# Patient Record
Sex: Male | Born: 1960 | Race: White | Hispanic: No | Marital: Single | Smoking: Current every day smoker
Health system: Southern US, Community
[De-identification: ages and names within clinical notes are randomized; demographics above are authoritative.]

## PROBLEM LIST (undated history)

## (undated) DIAGNOSIS — F32A Depression, unspecified: Secondary | ICD-10-CM

## (undated) DIAGNOSIS — B1920 Unspecified viral hepatitis C without hepatic coma: Secondary | ICD-10-CM

## (undated) DIAGNOSIS — F429 Obsessive-compulsive disorder, unspecified: Secondary | ICD-10-CM

## (undated) DIAGNOSIS — I10 Essential (primary) hypertension: Secondary | ICD-10-CM

## (undated) DIAGNOSIS — J449 Chronic obstructive pulmonary disease, unspecified: Secondary | ICD-10-CM

## (undated) DIAGNOSIS — F17200 Nicotine dependence, unspecified, uncomplicated: Secondary | ICD-10-CM

## (undated) DIAGNOSIS — F39 Unspecified mood [affective] disorder: Secondary | ICD-10-CM

## (undated) DIAGNOSIS — E669 Obesity, unspecified: Secondary | ICD-10-CM

## (undated) DIAGNOSIS — M1990 Unspecified osteoarthritis, unspecified site: Secondary | ICD-10-CM

## (undated) DIAGNOSIS — E119 Type 2 diabetes mellitus without complications: Secondary | ICD-10-CM

## (undated) DIAGNOSIS — F329 Major depressive disorder, single episode, unspecified: Secondary | ICD-10-CM

## (undated) HISTORY — DX: Essential (primary) hypertension: I10

## (undated) HISTORY — DX: Type 2 diabetes mellitus without complications: E11.9

## (undated) HISTORY — DX: Obsessive-compulsive disorder, unspecified: F42.9

## (undated) HISTORY — DX: Unspecified viral hepatitis C without hepatic coma: B19.20

## (undated) HISTORY — DX: Nicotine dependence, unspecified, uncomplicated: F17.200

## (undated) HISTORY — DX: Unspecified mood (affective) disorder: F39

## (undated) HISTORY — DX: Unspecified osteoarthritis, unspecified site: M19.90

## (undated) HISTORY — PX: COLONOSCOPY: SHX174

## (undated) HISTORY — DX: Major depressive disorder, single episode, unspecified: F32.9

## (undated) HISTORY — DX: Chronic obstructive pulmonary disease, unspecified: J44.9

## (undated) HISTORY — DX: Depression, unspecified: F32.A

## (undated) HISTORY — DX: Obesity, unspecified: E66.9

---

## 1999-10-13 ENCOUNTER — Ambulatory Visit: Admission: RE | Admit: 1999-10-13 | Discharge: 1999-10-13 | Payer: Self-pay | Admitting: Pulmonary Disease

## 1999-10-15 ENCOUNTER — Ambulatory Visit (HOSPITAL_COMMUNITY): Admission: RE | Admit: 1999-10-15 | Discharge: 1999-10-15 | Payer: Self-pay | Admitting: Pulmonary Disease

## 1999-10-15 ENCOUNTER — Encounter: Payer: Self-pay | Admitting: Pulmonary Disease

## 1999-11-16 ENCOUNTER — Emergency Department (HOSPITAL_COMMUNITY): Admission: EM | Admit: 1999-11-16 | Discharge: 1999-11-16 | Payer: Self-pay

## 1999-11-16 ENCOUNTER — Encounter: Payer: Self-pay | Admitting: Emergency Medicine

## 1999-12-01 ENCOUNTER — Ambulatory Visit (HOSPITAL_BASED_OUTPATIENT_CLINIC_OR_DEPARTMENT_OTHER): Admission: RE | Admit: 1999-12-01 | Discharge: 1999-12-01 | Payer: Self-pay | Admitting: Orthopedic Surgery

## 2002-01-26 ENCOUNTER — Emergency Department (HOSPITAL_COMMUNITY): Admission: EM | Admit: 2002-01-26 | Discharge: 2002-01-26 | Payer: Self-pay

## 2002-04-23 ENCOUNTER — Emergency Department (HOSPITAL_COMMUNITY): Admission: EM | Admit: 2002-04-23 | Discharge: 2002-04-23 | Payer: Self-pay | Admitting: Emergency Medicine

## 2008-12-15 ENCOUNTER — Ambulatory Visit: Payer: Self-pay | Admitting: Family Medicine

## 2008-12-19 ENCOUNTER — Ambulatory Visit: Payer: Self-pay | Admitting: Family Medicine

## 2009-03-19 ENCOUNTER — Observation Stay (HOSPITAL_COMMUNITY): Admission: EM | Admit: 2009-03-19 | Discharge: 2009-03-21 | Payer: Self-pay | Admitting: Emergency Medicine

## 2009-03-19 ENCOUNTER — Ambulatory Visit: Payer: Self-pay | Admitting: Gastroenterology

## 2009-04-20 ENCOUNTER — Ambulatory Visit: Payer: Self-pay | Admitting: Family Medicine

## 2009-05-14 ENCOUNTER — Ambulatory Visit: Payer: Self-pay | Admitting: Family Medicine

## 2010-01-28 ENCOUNTER — Ambulatory Visit: Payer: Self-pay | Admitting: Family Medicine

## 2010-05-05 ENCOUNTER — Ambulatory Visit: Payer: Self-pay | Admitting: Family Medicine

## 2010-05-10 ENCOUNTER — Ambulatory Visit: Payer: Self-pay | Admitting: Family Medicine

## 2010-07-06 ENCOUNTER — Ambulatory Visit: Payer: Self-pay | Admitting: Family Medicine

## 2010-11-06 LAB — CBC
Hemoglobin: 16.5 g/dL (ref 13.0–17.0)
MCHC: 34.1 g/dL (ref 30.0–36.0)
MCHC: 34.1 g/dL (ref 30.0–36.0)
MCHC: 34.2 g/dL (ref 30.0–36.0)
MCHC: 34.6 g/dL (ref 30.0–36.0)
MCV: 100.1 fL — ABNORMAL HIGH (ref 78.0–100.0)
MCV: 99.1 fL (ref 78.0–100.0)
Platelets: 183 10*3/uL (ref 150–400)
Platelets: 195 10*3/uL (ref 150–400)
RBC: 4.73 MIL/uL (ref 4.22–5.81)
RBC: 4.86 MIL/uL (ref 4.22–5.81)
RBC: 4.93 MIL/uL (ref 4.22–5.81)
RBC: 5.34 MIL/uL (ref 4.22–5.81)
RBC: 5.79 MIL/uL (ref 4.22–5.81)
RDW: 14.1 % (ref 11.5–15.5)
RDW: 14.2 % (ref 11.5–15.5)
RDW: 14.2 % (ref 11.5–15.5)
RDW: 14.3 % (ref 11.5–15.5)
RDW: 14.4 % (ref 11.5–15.5)
WBC: 10.8 10*3/uL — ABNORMAL HIGH (ref 4.0–10.5)
WBC: 10.9 10*3/uL — ABNORMAL HIGH (ref 4.0–10.5)
WBC: 8.1 10*3/uL (ref 4.0–10.5)

## 2010-11-06 LAB — BASIC METABOLIC PANEL
CO2: 26 mEq/L (ref 19–32)
Chloride: 105 mEq/L (ref 96–112)
GFR calc non Af Amer: >60 mL/min (ref >60)
Glucose, Bld: 108 mg/dL — ABNORMAL HIGH (ref 70–99)
Potassium: 4 mEq/L (ref 3.5–5.1)
Sodium: 136 mEq/L (ref 135–145)

## 2010-11-06 LAB — PROTIME-INR
INR: 1 (ref 0.00–1.49)
Prothrombin Time: 13 seconds (ref 11.6–15.2)

## 2010-11-06 LAB — POCT I-STAT, CHEM 8
Creatinine, Ser: 1 mg/dL (ref 0.4–1.5)
Glucose, Bld: 137 mg/dL — ABNORMAL HIGH (ref 70–99)
HCT: 61 % — ABNORMAL HIGH (ref 39.0–52.0)
Hemoglobin: 20.7 g/dL — ABNORMAL HIGH (ref 13.0–17.0)
Sodium: 135 mEq/L (ref 135–145)
TCO2: 20 mmol/L (ref 0–100)

## 2010-11-06 LAB — URINE CULTURE: Colony Count: 25000

## 2010-11-06 LAB — CROSSMATCH
ABO/RH(D): A NEG
Antibody Screen: NEGATIVE

## 2010-11-06 LAB — HEPATITIS PANEL, ACUTE
HCV Ab: REACTIVE — AB
Hep B C IgM: NEGATIVE
Hepatitis B Surface Ag: NEGATIVE

## 2010-11-06 LAB — GIARDIA/CRYPTOSPORIDIUM SCREEN(EIA)
Cryptosporidium Screen (EIA): NEGATIVE
Giardia Screen - EIA: NEGATIVE

## 2010-11-06 LAB — URINALYSIS, ROUTINE W REFLEX MICROSCOPIC
Glucose, UA: NEGATIVE mg/dL
Hgb urine dipstick: NEGATIVE
Protein, ur: NEGATIVE mg/dL
Specific Gravity, Urine: 1.025 (ref 1.005–1.030)
pH: 6 (ref 5.0–8.0)

## 2010-11-06 LAB — DIFFERENTIAL
Basophils Absolute: 0 10*3/uL (ref 0.0–0.1)
Basophils Relative: 0 % (ref 0–1)
Eosinophils Absolute: 0 10*3/uL (ref 0.0–0.7)
Monocytes Absolute: 1.2 10*3/uL — ABNORMAL HIGH (ref 0.1–1.0)
Monocytes Relative: 11 % (ref 3–12)
Neutro Abs: 9.6 10*3/uL — ABNORMAL HIGH (ref 1.7–7.7)
Neutrophils Relative %: 81 % — ABNORMAL HIGH (ref 43–77)

## 2010-11-06 LAB — RAPID URINE DRUG SCREEN, HOSP PERFORMED
Amphetamines: NOT DETECTED
Benzodiazepines: NOT DETECTED
Tetrahydrocannabinol: POSITIVE — AB

## 2010-11-06 LAB — HEPATIC FUNCTION PANEL
ALT: 80 U/L — ABNORMAL HIGH (ref 0–53)
AST: 49 U/L — ABNORMAL HIGH (ref 0–37)
Bilirubin, Direct: 0.1 mg/dL (ref 0.0–0.3)

## 2010-11-06 LAB — ERYTHROPOIETIN: Erythropoietin: 7.7 m[IU]/mL (ref 2.6–34.0)

## 2010-11-06 LAB — CLOSTRIDIUM DIFFICILE EIA

## 2010-11-06 LAB — TSH: TSH: 0.8 u[IU]/mL (ref 0.350–4.500)

## 2010-11-24 ENCOUNTER — Ambulatory Visit (INDEPENDENT_AMBULATORY_CARE_PROVIDER_SITE_OTHER): Payer: Self-pay | Admitting: *Deleted

## 2010-11-24 DIAGNOSIS — F39 Unspecified mood [affective] disorder: Secondary | ICD-10-CM

## 2010-11-24 DIAGNOSIS — F411 Generalized anxiety disorder: Secondary | ICD-10-CM

## 2010-11-24 DIAGNOSIS — G4730 Sleep apnea, unspecified: Secondary | ICD-10-CM

## 2010-11-24 DIAGNOSIS — F329 Major depressive disorder, single episode, unspecified: Secondary | ICD-10-CM

## 2010-12-09 ENCOUNTER — Encounter: Payer: Self-pay | Admitting: Medical

## 2010-12-14 NOTE — H&P (Signed)
[name], [name]                   ACCOUNT NO.:  [id number]   MEDICAL RECORD NO.:  [id number]          PATIENT TYPE:  EMS   LOCATION:  MAJO                         FACILITY:  [facility]   PHYSICIAN:  Eduard Clos, MDDATE OF BIRTH:  1960/08/04   DATE OF ADMISSION:  03/19/2009  DATE OF DISCHARGE:                              HISTORY & PHYSICAL   PRIMARY CARE PHYSICIAN:  Unassigned.   CHIEF COMPLAINT:  Diarrhea and bleeding per rectum.   HISTORY OF PRESENT ILLNESS:  A 50 year old male with a history of  arthritis presented to the ER because of persistent bleeding per rectum  over the last 1 day.  The patient stated symptoms started off Sunday,  that is 4 days ago, with bilateral lower quadrant pain with constant  diarrhea.  The patient had antibiotic stool months ago, amoxicillin for  some respiratory tract infection.  The diarrhea was constant and the  last 24 hours has been having bleeding per rectum.  The patient said it  is frank blood and has not had similar episode before.  The patient  denies any abdominal pain at this time.  He states that in the last 24  hours his abdominal pain has completely resolved.  He denies any nausea,  vomiting, fever, or chills.  He denies any chest pain, shortness of  breath, dizziness, loss of consciousness, focal deficits, headache.  In  the ER, the patient was found to hemoglobin around 19 which is being  confirmed again.  The patient is being admitted for further management  of his GI bleed and dehydration.   PAST MEDICAL HISTORY:  Arthritis.   PAST SURGICAL HISTORY:  Bilateral knee surgery and left rotator cuff  surgery.   MEDICATIONS ON ADMISSION:  The patient takes __________ medication for  gastritis and takes chondroitin sulfate and glucosamine for his  arthritis.   ALLERGIES:  No known drug allergies.   FAMILY HISTORY:  Noncontributory.   SOCIAL HISTORY:  The patient smokes cigarettes and drinks alcohol daily,  and also uses  cocaine.  The patient has been advised to quit all of  these habits.   REVIEW OF SYSTEMS.:  As in the history of presenting illness.  Nothing  else significant.   PHYSICAL EXAMINATION:  The patient was examined at bedside not in acute  distress.  VITAL SIGNS:  Blood pressure is 150/99, pulse 96 per minute, temperature  98.4, respirations 18, O2 sat 97%.  HEENT:  Anicteric.  No pallor.  CHEST:  Bilateral air entry present.  No rhonchi, no crepitation.  HEART:  S1 and S2 heard.  ABDOMEN:  Soft, nontender.  Bowel sounds heard.  No guarding, no  rigidity.  CNS:  Alert and oriented to time, place, and person.  Moves upper and  lower extremities 5/5.  Extremities:  Peripheral pulses felt.  No edema.   LABORATORY DATA:  CBC:  WBC on admission was 11.99, hemoglobin was 20.7,  and 61 of hematocrit which is reconfirmed as 19.7 and 57.7, MCV 100.  Basic metabolic panel:  Sodium 135, potassium 4.5, chloride 104, glucose  137,  BUN 14, creatinine 1, lipase 19.  UA is showing ketones 15,  nitrites and leukocytes are negative, glucose is negative, bilirubin  small.   ASSESSMENT:  1. Gastrointestinal bleed, possibly lower gastrointestinal.  2. Diarrhea.  3. Severe dehydration.  4. Polycythemia probably secondary to dehydration and chronic      obstructive pulmonary disease.  5. Polysubstance abuse.  6. Ongoing tobacco abuse.   PLAN:  1. We will admit the patient to Telemetry.  2. We will aggressively hydrate.  Keep the patient on strict intake      and output, on clear liquid diet.  3. CBC will be checked every 6 hours over the next 24 hours.  Type and      crossmatch, and hold, and transfuse significant loss of bladder or      if the patient becomes hemodynamically compromised.  4. GI consult has already been called by ER physician.  Dr. Juanda Chance of      [facility] has been consulted.  We will follow their      recommendation.  5. We will get a CT abdomen and pelvis to rule out colitis.   6. We will get stool studies, C dif, ova and parasites, and cultures.      Further recommendation as condition evolves.  The patient has been      strongly advised to quit his cigarette smoking, alcohol and drug      abuse.  Place the patient on Ativan p.r.n.      Eduard Clos, MD  Electronically Signed     ANK/MEDQ  D:  03/19/2009  T:  03/19/2009  Job:  [id number]

## 2010-12-14 NOTE — Discharge Summary (Signed)
NAMEDOCK, BACCAM                   ACCOUNT NO.:  [id number]   MEDICAL RECORD NO.:  [id number]          PATIENT TYPE:  OBV   LOCATION:  4710                         FACILITY:  [facility]   PHYSICIAN:  Richarda Overlie, MD       DATE OF BIRTH:  July 05, 1961   DATE OF ADMISSION:  03/19/2009  DATE OF DISCHARGE:  03/21/2009                               DISCHARGE SUMMARY   DISCHARGE DIAGNOSES:  1. Clostridium difficile colitis.  2. Dehydration.  3. Polysubstance abuse.  4. Lower gastrointestinal bleeding secondary to clostridium difficile      colitis.  5. Ongoing tobacco abuse.   </CONSULTATIOn  Dr Christella Hartigan, GI.   PROCEDURE:  CT of the abdomen and pelvis shows mural thickening  involving the colon most prominent in the rectosigmoid consistent with  pancolitis and enlargement of the left adrenal gland which cannot be  characterized at this time.  The adrenal lesion is about 13 mm.  CT scan  of the pelvis shows rectosigmoid bowel wall thickening consistent with  colitis.   SUBJECTIVE:  This is a 50 year old male who presents to the ER with a  chief complaint of diarrhea and bright red blood per rectum.  The  patient started having symptoms about 4 days prior to admission.  The  patient states that he was recently treated with amoxicillin about a  month ago for a respiratory tract infection.  The patient complained of  diffuse abdominal pain.  On initial admission, he was normotensive and  stool guaiacs were positive.  The patient was admitted for further  evaluation.   HOSPITAL COURSE:  1. Bloody diarrhea.  The patient was evaluated for bloody diarrhea by      [facility] as per the initial assessment.  Differential of his      pancolitis included ischemic secondary to cocaine use versus      infectious process.  The patient's stool studies did come back for      C. diff colitis and a 14 day course of treatment with Flagyl was      advised.  I did speak to Dr. Christella Hartigan on the day of discharge  and no      anticipated procedures are being planned at this point.  Therefore,      the patient will be discharged today.  2. Polysubstance abuse.  The patient did admit to use of alcohol and      cocaine.  The patient states that his last drink of alcohol was on      Saturday.  He did have some withdrawal symptoms on Monday and      Tuesday.  It has been almost 7 days since the patient's last drink.      I do not anticipate any withdrawal symptoms at this time.      Certainly, the patient did not demonstrate any during his stay in      the last 2 days.  The patient will be provided information for      considering outpatient rehab for polysubstance abuse prior to  discharge.  3. History of polycythemia.  The patient's initial hemoglobin at the      time of presentation was about 19.  Initial hemoglobin was 20.7.      The patient's hemoglobin did decrease to 15.8 on March 20, 2009,      but it was about 16 at the time of discharge.  A repeat CBC would      be indicated only if the patient continues to have persistent      bleeding.   DISCHARGE MEDICATIONS:  1. Flagyl 500 mg p.o. t.i.d. for 14 days.  2. Folic acid 1 mg p.o. daily.  3. Thiamine 100 mg p.o. daily.  4. Multivitamin 1 tablet p.o. daily.  5. Nicotine patch 21 mg per day x2 weeks; 14 mg per day x 6 weeks and      7 mg per day x2 weeks.  The patient is advised to return.   DISCHARGE INSTRUCTIONS:  1. The patient is advised to go on a lactose-free, mechanical soft      diet.  2. Call [facility], phone number provided, if the patient      continues to have persistent GI bleeding, nausea, vomiting and      fever.  3. Follow up with PCP in [place] in 5-7 days.      Richarda Overlie, MD  Electronically Signed     NA/MEDQ  D:  03/21/2009  T:  03/21/2009  Job:  [id number]
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
## 2010-12-21 ENCOUNTER — Ambulatory Visit (INDEPENDENT_AMBULATORY_CARE_PROVIDER_SITE_OTHER): Payer: Self-pay | Admitting: Medical

## 2010-12-21 ENCOUNTER — Encounter: Payer: Self-pay | Admitting: Medical

## 2010-12-21 VITALS — BP 130/90 | HR 60 | Ht 68.0 in | Wt 205.0 lb

## 2010-12-21 DIAGNOSIS — K219 Gastro-esophageal reflux disease without esophagitis: Secondary | ICD-10-CM

## 2010-12-21 DIAGNOSIS — F329 Major depressive disorder, single episode, unspecified: Secondary | ICD-10-CM

## 2010-12-21 DIAGNOSIS — F39 Unspecified mood [affective] disorder: Secondary | ICD-10-CM

## 2010-12-21 DIAGNOSIS — F411 Generalized anxiety disorder: Secondary | ICD-10-CM

## 2010-12-21 DIAGNOSIS — G479 Sleep disorder, unspecified: Secondary | ICD-10-CM

## 2010-12-21 DIAGNOSIS — F339 Major depressive disorder, recurrent, unspecified: Secondary | ICD-10-CM | POA: Insufficient documentation

## 2010-12-21 DIAGNOSIS — F419 Anxiety disorder, unspecified: Secondary | ICD-10-CM

## 2010-12-21 MED ORDER — CARBAMAZEPINE ER 200 MG PO CP12: 200.0000 mg | ORAL_CAPSULE | Freq: Two times a day (BID) | ORAL | Status: DC

## 2010-12-21 MED ORDER — OMEPRAZOLE 20 MG PO CPDR: 20.0000 mg | DELAYED_RELEASE_CAPSULE | Freq: Every day | ORAL | Status: DC

## 2010-12-21 NOTE — Progress Notes (Signed)
Subjective:     [name] is a 50 y.o. male who presents for follow up of depression. He saw me on 11/24/10 for depressed mood and irritability, sleep issues. At that time he was started on Depakote 500 mg twice a day and continued on Celexa 40 mg a day. Current symptoms include depressed mood, insomnia and psychomotor agitation. Symptoms have been gradually improving since that time. Patient denies difficulty concentrating, feelings of worthlessness/guilt, hopelessness and suicidal attempt. Previous treatment includes: Wellbutrin, Zoloft, Xanax, Celexa, and Depakote most recently. He complains of the following side effects from the treatment: headache.  The following portions of the patient's history were reviewed and updated as appropriate: allergies, current medications, past family history, past medical history, past social history, past surgical history and problem list.  Review of Systems A comprehensive review of systems was negative.   General: No recent weight change, no anorexia Neuro: No sleep changes Psychology: Denies suicidal ideation, hallucinations, drug use  Objective:    BP 130/90  Pulse 60  Ht 5\' 8"  (1.727 m)  Wt 205 lb (92.987 kg)  BMI 31.17 kg/m2   General appearance: alert, no distress, WD/WN, male Heart: RRR, normal S1, S2, no murmurs Lungs: CTA bilaterally, no wheezes, rhonchi, or rales Psychiatric: normal affect, behavior normal, pleasant, normal hygiene and grooming.        Assessment:   Encounter Diagnoses  Name Primary?  . GERD (gastroesophageal reflux disease) Yes  . Depression   . Anxiety   . Sleep disturbance   . Mood disorder       Plan:   GERD-improved with Prilosec over-the-counter. Prescription given today for omeprazole 20 mg.  Depression/anxiety/mood disorder-Discussed symptoms and concerns.  No real improvement with Depakote. Thus I gave him instructions to wean off of Depakote, he will continue Celexa 40 mg daily. Once he is  weaned off Depakote, he will begin carbamazepine. I spent considerable amount of time on the phone with his pharmacy researching prices and options for treatment, and carbamazepine would be the cheapest option. Gave counseling on his stressors and things that trigger his irritability. Consider counseling. He will call report in 2-3 weeks and let me know how he is doing.  Return or call sooner if worse or no improvement.  Sleep disturbance, much improved since last visit  Advised he limit his alcohol use, particularly in relation to medication interaction and overall health.  Greater than 25 minutes spent face-to-face discussing concerns, evaluation, treatment plan, and followup.

## 2010-12-21 NOTE — Patient Instructions (Signed)
Depakote 500 mg.  Take 1 tablet daily x 3 days;  Then take 1 tablet every other day for 1 week, then stop Depakote.  Celexa - continue 40mg  daily.  Carbamazepine - once you have completely weaned off Depakote,  Begin 1 tablet once daily for 3 days, then go to twice daily.

## 2010-12-28 ENCOUNTER — Telehealth: Payer: Self-pay | Admitting: Medical

## 2010-12-29 ENCOUNTER — Telehealth: Payer: Self-pay | Admitting: *Deleted

## 2010-12-29 NOTE — Telephone Encounter (Signed)
Called and notified pt of new prescription Carbamazepene 200 mg that is ready to be picked up and the cost would be  $12 a month.  Pt stated "thank you so much".  CM, LPN

## 2010-12-29 NOTE — Telephone Encounter (Signed)
I called and spoke to pharmacist [place].  We changed it to Carbamazepene 200mg  immediate release, 1 tablet 3 times daily.  This will be $12/mo.   It should be ready for him now.

## 2011-01-14 NOTE — Telephone Encounter (Signed)
DONE

## 2011-01-20 ENCOUNTER — Telehealth: Payer: Self-pay | Admitting: Medical

## 2011-01-20 NOTE — Telephone Encounter (Signed)
[name] is taking new medication, Epitol. It is working very and is helping quite a bit.

## 2011-01-21 NOTE — Telephone Encounter (Signed)
pls pull his chart

## 2011-01-24 NOTE — Telephone Encounter (Signed)
Called pt to let him know of labs that need to be done periodically because he is on Epitol.  Pt is out of town but will call to schedule a nurse visit for labs when he returns within the next month.  CM, LPN

## 2011-01-24 NOTE — Telephone Encounter (Signed)
Glad to hear its helping.  I will need to check some labs - (Carbamazipine level, ALT, CBC w/ diff) within the next month at his convenience.  This medication requires monitoring periodically.  Preferably, have him come first thing in the morning before he takes his dose for labs.

## 2011-02-08 ENCOUNTER — Encounter: Payer: Self-pay | Admitting: Medical

## 2011-02-08 ENCOUNTER — Ambulatory Visit (INDEPENDENT_AMBULATORY_CARE_PROVIDER_SITE_OTHER): Payer: Self-pay | Admitting: Medical

## 2011-02-08 VITALS — BP 116/82 | HR 80 | Temp 98.2°F | Ht 68.0 in | Wt 214.0 lb

## 2011-02-08 DIAGNOSIS — Z79899 Other long term (current) drug therapy: Secondary | ICD-10-CM

## 2011-02-08 DIAGNOSIS — R635 Abnormal weight gain: Secondary | ICD-10-CM

## 2011-02-08 DIAGNOSIS — F329 Major depressive disorder, single episode, unspecified: Secondary | ICD-10-CM

## 2011-02-08 DIAGNOSIS — F911 Conduct disorder, childhood-onset type: Secondary | ICD-10-CM

## 2011-02-08 DIAGNOSIS — R454 Irritability and anger: Secondary | ICD-10-CM

## 2011-02-08 DIAGNOSIS — F429 Obsessive-compulsive disorder, unspecified: Secondary | ICD-10-CM

## 2011-02-08 DIAGNOSIS — F3289 Other specified depressive episodes: Secondary | ICD-10-CM

## 2011-02-08 NOTE — Progress Notes (Signed)
Subjective:   HPI  [name] is a 50 y.o. male who presents for recheck on medication and chronic issues.  At his last visit on 12/21/10 we wean him off Depakote and changed him to carbamazepine twice a day. He notes for the first month this seemed to help with his mood and anger outbursts, but now it isn't helping as much.  He states he's always had a high tolerance for medication and things tend to wear off quickly.  His main symptoms continue to be anger outburst. He relates this to his alcohol use. His caretaker notes that when he drinks, he would drink usually 3 ounces of vodka, but if no one controls his access to alcohol he'll continue drink until he runs out. He notes that he usually doesn't have problems of anger if he does not drink alcohol.  Otherwise the medication is helping him sleep and helping his depression.  He is only exercising about one day per week. He has had a 14 pound weight gain since starting carbamazepine.  No other complaints today.  The following portions of the patient's history were reviewed and updated as appropriate: allergies, current medications, past family history, past medical history, past social history, past surgical history and problem list.  Past Medical History  Diagnosis Date  . COPD (chronic obstructive pulmonary disease)   . Hypertension   . Tobacco use disorder   . Depression   . Hepatitis C   . Arthritis   . Obsessive compulsive disorder     Review of Systems Constitutional: denies fever, chills, sweats, unexpected weight change, anorexia, fatigue Cardiology: denies chest pain, palpitations, edema Respiratory: denies cough, shortness of breath, wheezing Gastroenterology: denies abdominal pain, nausea, vomiting, diarrhea, constipation Ophthalmology: denies vision changes Urology: denies dysuria, difficulty urinating, hematuria, urinary frequency, urgency Neurology: no headache, weakness, tingling, numbness      Objective:   Physical Exam  General appearance: alert, no distress, WD/WN, white male, overweight Oral cavity: MMM, no lesions Neck: supple, no lymphadenopathy, no thyromegaly, no masses Heart: RRR, normal S1, S2, no murmurs Lungs: CTA bilaterally, no wheezes, rhonchi, or rales Abdomen: +bs, soft, non tender, non distended, no masses, no hepatomegaly, no splenomegaly Extremities: no edema, no cyanosis, no clubbing Pulses: 2+ symmetric, upper and lower extremities, normal cap refill Psychiatric: anxious appearing today, otherwise pleasant    Assessment :    Encounter Diagnoses  Name Primary?  . Depression Yes  . Weight gain   . Encounter for long-term (current) use of other medications   . Obsessive compulsive disorder   . Excessive anger      Plan:    Depression-improved on carbamazepine, however he is taking 3 times a day versus twice a day as instructed. I will discuss case with Dr. Susann Givens and call patient back with medication changes. He is also taking Celexa 40 mg daily. Labs today for surveillance.  Of note, his hemoglobin A1c has been borderline. We will check a Tegretol level and other labs today.  Weight gain-likely due to carbamazepine.  I will try to bring him back down to twice a day dosing.  OCD - discussed self awareness of episodes, c/t Celexa.     Anger -  spent a great deal of time talking about his anger issues. I advised he avoid alcohol together. Talk about respect for other people. Advised he use exercise, walks outside, listening to music, and having a daily routine to help deal with his issues. Advise he listen to some other  music besides heavy metal all the time.

## 2011-02-09 LAB — COMPREHENSIVE METABOLIC PANEL
Albumin: 4.5 g/dL (ref 3.5–5.2)
Alkaline Phosphatase: 58 U/L (ref 39–117)
CO2: 27 mEq/L (ref 19–32)
Chloride: 102 mEq/L (ref 96–112)
Glucose, Bld: 108 mg/dL — ABNORMAL HIGH (ref 70–99)
Potassium: 5.2 mEq/L (ref 3.5–5.3)
Sodium: 139 mEq/L (ref 135–145)
Total Protein: 7.3 g/dL (ref 6.0–8.3)

## 2011-02-09 LAB — HEMOGLOBIN A1C: Hgb A1c MFr Bld: 6 % — ABNORMAL HIGH (ref <5.7)

## 2011-02-10 ENCOUNTER — Telehealth: Payer: Self-pay | Admitting: *Deleted

## 2011-02-10 NOTE — Telephone Encounter (Addendum)
Message copied by Dorthula Perfect on Thu Feb 10, 2011  8:54 AM ------      Message from: Aleen Campi, DAVID S      Created: Thu Feb 10, 2011  8:34 AM       His glucose is still elevated and his labs show that he is at risk for diabetes but not diabetic at this time.  His liver test are elevated but not as bad as prior.  Otherwise, his kidney and lytes are ok, and the Carbamazepine level is fine.  I would like him to try the Carbamezapine just twice daily instead of 3x/day.  I want him to avoid alcohol altogether.  C/t same meds otherwise, try and walk or do some type of exercise most day of the week.  Lets see him back in 18mo, sooner prn.   Pt notified of lab results.  Pt will call back to schedule a 3 month follow up.  CM, LPN

## 2011-05-02 ENCOUNTER — Other Ambulatory Visit: Payer: Self-pay | Admitting: Family Medicine

## 2011-05-02 DIAGNOSIS — I10 Essential (primary) hypertension: Secondary | ICD-10-CM

## 2011-05-03 NOTE — Telephone Encounter (Signed)
Is this ok?

## 2011-05-09 ENCOUNTER — Other Ambulatory Visit: Payer: Self-pay | Admitting: Medical

## 2011-05-09 ENCOUNTER — Telehealth: Payer: Self-pay | Admitting: Medical

## 2011-05-09 MED ORDER — CARBAMAZEPINE ER 200 MG PO CP12: 200.0000 mg | ORAL_CAPSULE | Freq: Three times a day (TID) | ORAL | Status: DC

## 2011-05-09 NOTE — Telephone Encounter (Signed)
Call and see if he is doing ok regarding his mood on the current medications?  I sent refill requested.

## 2011-05-10 NOTE — Telephone Encounter (Signed)
Patient said that he was better with his mood changes since the medication and he said that he has not gained any weight so that's another good thing. CLS

## 2011-05-19 ENCOUNTER — Telehealth: Payer: Self-pay | Admitting: Family Medicine

## 2011-05-19 NOTE — Telephone Encounter (Signed)
[name] left message that he has been trying to get the Carbetol refilled.  It was done on 10/8.  I called [facility] [phone] and called it in again.  Left message for pt done.

## 2011-05-24 ENCOUNTER — Telehealth: Payer: Self-pay | Admitting: Family Medicine

## 2011-05-24 NOTE — Telephone Encounter (Signed)
PATIENTS RX WAS CALLED OUT TO THE [facility]. TEGRETOL 200MG  THE GENERIC BRAND. cls

## 2011-05-24 NOTE — Telephone Encounter (Signed)
Patient called about his rx Epitol. He said that carbamezapine the extended release cost over a hundred dollars and he cant afford this. Can you change it ti what it was orginally the epitol only cost him 12 dollars. CLS [facility] [phone]

## 2011-05-30 ENCOUNTER — Telehealth: Payer: Self-pay | Admitting: Medical

## 2011-05-30 NOTE — Telephone Encounter (Signed)
Rx refill for Bp medication was sent to the pharmacy on 05/30/11. CLS

## 2011-05-31 ENCOUNTER — Other Ambulatory Visit: Payer: Self-pay | Admitting: Medical

## 2011-05-31 MED ORDER — LISINOPRIL-HYDROCHLOROTHIAZIDE 20-12.5 MG PO TABS: 1.0000 | ORAL_TABLET | Freq: Every day | ORAL | Status: DC

## 2011-05-31 MED ORDER — CITALOPRAM HYDROBROMIDE 40 MG PO TABS: 40.0000 mg | ORAL_TABLET | Freq: Every day | ORAL | Status: DC

## 2011-07-27 ENCOUNTER — Telehealth: Payer: Self-pay | Admitting: Family Medicine

## 2011-07-27 MED ORDER — CARBAMAZEPINE 200 MG PO TABS: 200.0000 mg | ORAL_TABLET | Freq: Three times a day (TID) | ORAL | Status: DC

## 2011-07-27 MED ORDER — VERAPAMIL HCL 180 MG (CO) PO TB24: 180.0000 mg | ORAL_TABLET | Freq: Every day | ORAL | Status: DC

## 2011-07-27 NOTE — Telephone Encounter (Signed)
Pharmacy called and stated this was a new pt for them. Needs refill on verapamil 180  And tegretol 200 mg send to [facility] [address] [place]

## 2011-11-01 ENCOUNTER — Telehealth: Payer: Self-pay | Admitting: Internal Medicine

## 2011-11-01 NOTE — Telephone Encounter (Signed)
Would like a 90 day supply since its expensive.

## 2011-11-02 ENCOUNTER — Other Ambulatory Visit: Payer: Self-pay | Admitting: Medical

## 2011-11-02 MED ORDER — CITALOPRAM HYDROBROMIDE 40 MG PO TABS: 40.0000 mg | ORAL_TABLET | Freq: Every day | ORAL | Status: DC

## 2011-11-02 NOTE — Telephone Encounter (Signed)
Citalopram refilled.  Lets see him back soon for recheck.

## 2011-11-02 NOTE — Telephone Encounter (Signed)
Pt called back but was unable to talk to him. Did call pt back and leave a message that med was filled for him and that he needed to come in soon for a recheck that if he could call and schedule an appt that would be great.

## 2011-11-11 ENCOUNTER — Encounter: Payer: Self-pay | Admitting: Medical

## 2011-11-11 ENCOUNTER — Other Ambulatory Visit: Payer: Self-pay | Admitting: Medical

## 2011-11-11 ENCOUNTER — Ambulatory Visit (INDEPENDENT_AMBULATORY_CARE_PROVIDER_SITE_OTHER): Payer: Self-pay | Admitting: Medical

## 2011-11-11 VITALS — BP 130/90 | HR 88 | Temp 98.2°F | Resp 16 | Wt 212.0 lb

## 2011-11-11 DIAGNOSIS — I10 Essential (primary) hypertension: Secondary | ICD-10-CM

## 2011-11-11 DIAGNOSIS — Z79899 Other long term (current) drug therapy: Secondary | ICD-10-CM

## 2011-11-11 DIAGNOSIS — Z125 Encounter for screening for malignant neoplasm of prostate: Secondary | ICD-10-CM

## 2011-11-11 DIAGNOSIS — F429 Obsessive-compulsive disorder, unspecified: Secondary | ICD-10-CM

## 2011-11-11 DIAGNOSIS — F39 Unspecified mood [affective] disorder: Secondary | ICD-10-CM

## 2011-11-11 DIAGNOSIS — F17200 Nicotine dependence, unspecified, uncomplicated: Secondary | ICD-10-CM

## 2011-11-11 DIAGNOSIS — R351 Nocturia: Secondary | ICD-10-CM

## 2011-11-11 DIAGNOSIS — F329 Major depressive disorder, single episode, unspecified: Secondary | ICD-10-CM

## 2011-11-11 MED ORDER — TAMSULOSIN HCL 0.4 MG PO CAPS: 0.4000 mg | ORAL_CAPSULE | Freq: Every day | ORAL | Status: DC

## 2011-11-12 ENCOUNTER — Encounter: Payer: Self-pay | Admitting: Medical

## 2011-11-12 DIAGNOSIS — F429 Obsessive-compulsive disorder, unspecified: Secondary | ICD-10-CM | POA: Insufficient documentation

## 2011-11-12 DIAGNOSIS — I10 Essential (primary) hypertension: Secondary | ICD-10-CM | POA: Insufficient documentation

## 2011-11-12 LAB — CBC WITH DIFFERENTIAL/PLATELET
Basophils Relative: 1 % (ref 0–1)
Eosinophils Absolute: 0.2 10*3/uL (ref 0.0–0.7)
MCH: 34.2 pg — ABNORMAL HIGH (ref 26.0–34.0)
MCHC: 33.5 g/dL (ref 30.0–36.0)
Neutrophils Relative %: 62 % (ref 43–77)
Platelets: 239 10*3/uL (ref 150–400)

## 2011-11-12 LAB — COMPREHENSIVE METABOLIC PANEL
ALT: 202 U/L — ABNORMAL HIGH (ref 0–53)
AST: 138 U/L — ABNORMAL HIGH (ref 0–37)
CO2: 26 mEq/L (ref 19–32)
Creat: 0.86 mg/dL (ref 0.50–1.35)
Total Bilirubin: 0.6 mg/dL (ref 0.3–1.2)

## 2011-11-12 LAB — CARBAMAZEPINE LEVEL, TOTAL: Carbamazepine Lvl: 4.2 ug/mL (ref 4.0–12.0)

## 2011-11-12 LAB — RPR

## 2011-11-12 LAB — T4: T4, Total: 10 ug/dL (ref 5.0–12.5)

## 2011-11-12 LAB — LIPID PANEL
Cholesterol: 181 mg/dL (ref 0–200)
HDL: 41 mg/dL (ref >39)
Triglycerides: 211 mg/dL — ABNORMAL HIGH (ref <150)

## 2011-11-12 LAB — T4, FREE: Free T4: 1.26 ng/dL (ref 0.80–1.80)

## 2011-11-12 LAB — PSA: PSA: 1.14 ng/mL (ref <=4.00)

## 2011-11-12 LAB — TSH: TSH: 2.85 u[IU]/mL (ref 0.350–4.500)

## 2011-11-12 NOTE — Progress Notes (Signed)
Subjective:   HPI  [name] is a 51 y.o. male who presents for general recheck on medications.  He and his caretaker say he continues to do well on Carbamazepine and Celexa.  He does have some added stress soon as his son is getting married.  He has been overly anxious trying to pick out clothes, trying to get mentally prepared for the wedding.  He notes that his ex wife will be there as well as some other people that he doesn't normally talk to any more.  He is happy though to be a part of his son's big day.  He has a good relationship with daughter in law to be side of the family.   His caretaker notes that he takes his medication all at one time of the day, although he is suppose to be taking Carbamazepine BID.  otherwise is compliant with mediation.  He is still smoking.  No other aggravating or relieving factors.    No other c/o.  The following portions of the patient's history were reviewed and updated as appropriate: allergies, current medications, past family history, past medical history, past social history, past surgical history and problem list.  Past Medical History  Diagnosis Date  . COPD (chronic obstructive pulmonary disease)   . Hypertension   . Tobacco use disorder   . Depression   . Hepatitis C   . Arthritis   . Obsessive compulsive disorder     No Known Allergies   Review of Systems ROS reviewed and was negative other than noted in HPI or above.    Objective:   Physical Exam  General appearance: alert, WD/WN, seems anxious and somewhat irritable today HEENT: normocephalic, sclerae anicteric, TMs pearly, nares patent, no discharge or erythema, pharynx normal Oral cavity: MMM, no lesions, teeth with moderate plaque Neck: supple, no lymphadenopathy, no thyromegaly, no masses, no bruits Heart: RRR, normal S1, S2, no murmurs Lungs: CTA bilaterally, no wheezes, rhonchi, or rales Abdomen: +bs, soft, non tender, non distended, no masses, no hepatomegaly, no  splenomegaly Pulses: 2+ symmetric, upper and lower extremities, normal cap refill   Assessment and Plan :     Encounter Diagnoses  Name Primary?  . Essential hypertension, benign Yes  . Depression   . OCD (obsessive compulsive disorder)   . Mood disorder   . Encounter for long-term (current) use of other medications   . Screening PSA (prostate specific antigen)   . Nocturia   . Tobacco use disorder    HTN - c/t current medications  Depression/OCT/Mood disorder - ideally he should be seeing psychiatry but won't go due to cost and somewhat due to lack of trust with a new provider.  I question other diagnoses such as bipolar given his ease of agitation and irritability as well as mood swings.  For now he continues to do reasonably well on Carbamazepine and Celexa.  Advised he take Carbamazepine, 1 tablet BID instead of both tablets once daily.  His caretaker does a great job with him and he notes that overall he does well.  He has his anger tantrums in the mornings for a few minutes, but after that is fine most of the day.  I recommended he file for disability to get help with medical coverage as other medications would probably do better for him.  For now, we will c/t current medications as they still provide benefit for him.  General surveillance labs today.  PSA today, he declines rectal exam.  Nocturia -  likely due to BPH.  Will try Flomax for his urinary frequency.  Tobacco use - heavy tobacco user, no plans to quit.

## 2011-12-01 ENCOUNTER — Telehealth: Payer: Self-pay | Admitting: Medical

## 2011-12-01 DIAGNOSIS — I10 Essential (primary) hypertension: Secondary | ICD-10-CM

## 2011-12-01 MED ORDER — LISINOPRIL-HYDROCHLOROTHIAZIDE 20-12.5 MG PO TABS: 1.0000 | ORAL_TABLET | Freq: Every day | ORAL | Status: DC

## 2011-12-01 NOTE — Telephone Encounter (Signed)
Sent 90 day supply in

## 2011-12-01 NOTE — Telephone Encounter (Signed)
Sent 90 day supply

## 2012-02-27 ENCOUNTER — Telehealth: Payer: Self-pay | Admitting: Internal Medicine

## 2012-02-27 MED ORDER — CITALOPRAM HYDROBROMIDE 40 MG PO TABS: 40.0000 mg | ORAL_TABLET | Freq: Every day | ORAL | Status: DC

## 2012-02-27 NOTE — Telephone Encounter (Signed)
Celexa renewed 

## 2012-03-12 ENCOUNTER — Telehealth: Payer: Self-pay | Admitting: Family Medicine

## 2012-03-12 MED ORDER — VERAPAMIL HCL 180 MG (CO) PO TB24: 180.0000 mg | ORAL_TABLET | Freq: Every day | ORAL | Status: DC

## 2012-03-12 NOTE — Telephone Encounter (Signed)
Sent med in even though he is a [facility] pt

## 2012-03-27 ENCOUNTER — Telehealth: Payer: Self-pay | Admitting: Internal Medicine

## 2012-03-27 DIAGNOSIS — I10 Essential (primary) hypertension: Secondary | ICD-10-CM

## 2012-03-27 MED ORDER — LISINOPRIL-HYDROCHLOROTHIAZIDE 20-12.5 MG PO TABS: 1.0000 | ORAL_TABLET | Freq: Every day | ORAL | Status: DC

## 2012-03-27 NOTE — Telephone Encounter (Signed)
Request for refill of b/p meds sent in

## 2012-05-16 ENCOUNTER — Ambulatory Visit: Payer: Self-pay | Admitting: Medical

## 2012-07-01 DIAGNOSIS — E119 Type 2 diabetes mellitus without complications: Secondary | ICD-10-CM

## 2012-07-01 HISTORY — DX: Type 2 diabetes mellitus without complications: E11.9

## 2012-07-02 ENCOUNTER — Other Ambulatory Visit: Payer: Self-pay | Admitting: Family Medicine

## 2012-07-03 NOTE — Telephone Encounter (Signed)
Sent med in left message for pt to make apt in the next month or so

## 2012-07-03 NOTE — Telephone Encounter (Signed)
Go ahead and renew this but set him up for an appointment within the next month or so

## 2012-07-03 NOTE — Telephone Encounter (Signed)
Is this ok?

## 2012-07-10 ENCOUNTER — Encounter: Payer: Self-pay | Admitting: Medical

## 2012-07-10 ENCOUNTER — Ambulatory Visit (INDEPENDENT_AMBULATORY_CARE_PROVIDER_SITE_OTHER): Payer: Self-pay | Admitting: Medical

## 2012-07-10 VITALS — BP 118/82 | HR 82 | Temp 97.8°F | Resp 18 | Wt 214.0 lb

## 2012-07-10 DIAGNOSIS — E119 Type 2 diabetes mellitus without complications: Secondary | ICD-10-CM

## 2012-07-10 DIAGNOSIS — F411 Generalized anxiety disorder: Secondary | ICD-10-CM

## 2012-07-10 DIAGNOSIS — E669 Obesity, unspecified: Secondary | ICD-10-CM

## 2012-07-10 DIAGNOSIS — F429 Obsessive-compulsive disorder, unspecified: Secondary | ICD-10-CM

## 2012-07-10 DIAGNOSIS — B1920 Unspecified viral hepatitis C without hepatic coma: Secondary | ICD-10-CM

## 2012-07-10 DIAGNOSIS — I10 Essential (primary) hypertension: Secondary | ICD-10-CM

## 2012-07-10 DIAGNOSIS — F419 Anxiety disorder, unspecified: Secondary | ICD-10-CM

## 2012-07-10 DIAGNOSIS — F39 Unspecified mood [affective] disorder: Secondary | ICD-10-CM

## 2012-07-10 DIAGNOSIS — F329 Major depressive disorder, single episode, unspecified: Secondary | ICD-10-CM

## 2012-07-10 DIAGNOSIS — N400 Enlarged prostate without lower urinary tract symptoms: Secondary | ICD-10-CM

## 2012-07-10 DIAGNOSIS — F17200 Nicotine dependence, unspecified, uncomplicated: Secondary | ICD-10-CM

## 2012-07-10 NOTE — Progress Notes (Signed)
Subjective: Here for routine f/u.  Mr. [name] is here today accompanied by his care provider [name].    I first saw "[name]" in 10/2010, and have seen him in follow up since. He has been a longstanding patient of Dr. Jola Babinski here at our clinic since at least 2010.   He has a history of depression, anxiety, OCD, and likely mood disorder.   He has limited visits and other referrals up to this point due to costs.  He does not work.  He lives in a group home, has several roommates.  We have been working with him on his medications and mood.  He has declined psychiatry referral at least since I've known him.   I last saw him in April 2013.  At that time he was going to be attending his son's wedding, had lots of anxiety about that.   Was worried about potential arguments or conflicts since his ex-wife and her new husband were going to be there too.  He notes that the wedding actually went great, had a wonderful time, had a peaceful interaction with ex-wife, and there was no arguments or fights.  The experience went well.    His caregiver thinks his celexa needs to be increased.  He notes that he will have 2-3 months of better mood and affect, then 2-3 months of worse symptoms.  His bad times include irritability, yelling, begin agitated, quit to snap, at times worse depression, at times seems hostile, but then he can have periods of baseline anxiety without all the hostile/irritable mood.    He continues to smoke but only at 1ppd now.  He was smoking up to 3ppd in the past.  He admits to drinking 3 shots of Vodka daily.    He has hx/o hepatitis C.  In April I advised him to go ahead and get appt with hepatitis clinic at [facility].  He has still not done this.   We have postponed additional lab evaluation prior due to costs and the hopes that he would get in the hepatitis clinic and get on Medicaid or Medicare.  He last applied for Medicare 6 years ago and was denied.    He is not exercising.  Regarding  diet, he drinks water, milk and vodka.  He doesn't drink regular soda, sweet tea, or lots of sweets.  However, he does eat fruits, some vegetables, meat, and a fair amount of breads.    Here for f/u on hypertension.  Compliant with medication without complaint.  Didn't get the medication for BPH due to cost.  Past Medical History  Diagnosis Date  . COPD (chronic obstructive pulmonary disease)   . Hypertension   . Tobacco use disorder   . Depression   . Hepatitis C   . Arthritis   . Obsessive compulsive disorder   . Obesity   . Mood disorder   . Diabetes mellitus without complication 12/13   ROS as in HPI    Objective:   Physical Exam  Filed Vitals:   07/10/12 1046  BP: 118/82  Pulse: 82  Temp: 97.8 F (36.6 C)  Resp: 18    General appearance: alert, no distress, WD/WN Neck: supple, no lymphadenopathy, no thyromegaly, no masses Heart: RRR, normal S1, S2, no murmurs Lungs: CTA bilaterally, no wheezes, rhonchi, or rales Abdomen: +bs, soft, obese, RUQ tenderness, hepatomegaly, otherwise non tender, non distended, no masses, no splenomegaly Pulses: 2+ symmetric Ext: no edema  Assessment and Plan :    Encounter  Diagnoses  Name Primary?  . Type II or unspecified type diabetes mellitus without mention of complication, not stated as uncontrolled Yes  . Essential hypertension, benign   . Depression   . Mood disorder   . Anxiety   . OCD (obsessive compulsive disorder)   . Hepatitis C   . Obesity   . Tobacco use disorder   . BPH (benign prostatic hyperplasia)    DM type II - new diagnosis today.  He has had impaired glucose for some time, but HgbA1C now 6.6% today.  Begin Metformin once daily.   He is not motivated to make diet and exercise changes, but advised he work on eating healthier, exercising regularly.    HTN - controlled on current medication  Depression, mood disorder, anxiety, OCD - we have been managing his medications for now.   Ideally he would be  followed by psychiatry.   C/t celexa 40mg  daily.   I will call pharmacy about costs and options.  He is taking Carbamazepine 200mg , 2 tablets daily.   He is suppose to be using 1 tablet twice daily.  He changed on his own to once daily due to forgetting to take the medication.   Will consider XR daily vs increasing to 300mg  BID regular release.    Hep C - referral to [facility]  Tobacco use - we have discussed the dangers of tobacco prior, but he is not ready to stop and unlikely to stop  BPH - declines medication.  No worsening c/o currently.  Advised he apply for Medicaid/medicare.  He is due for other preventative care, referrals that he has declined for now.  Follow-up 36mo, pending call back

## 2012-08-01 ENCOUNTER — Other Ambulatory Visit: Payer: Self-pay | Admitting: Family Medicine

## 2012-08-02 NOTE — Telephone Encounter (Signed)
IS THIS OK 

## 2012-08-02 NOTE — Telephone Encounter (Signed)
Don't let them run out he needs an appointment

## 2012-08-02 NOTE — Telephone Encounter (Signed)
Is this ok?

## 2012-08-03 ENCOUNTER — Other Ambulatory Visit: Payer: Self-pay

## 2012-08-03 MED ORDER — CARBAMAZEPINE 200 MG PO TABS: 200.0000 mg | ORAL_TABLET | Freq: Two times a day (BID) | ORAL | Status: DC

## 2012-08-03 NOTE — Telephone Encounter (Signed)
Called med in also called pt left message that i have called in 90 days of his med but with no refill till apt

## 2012-08-04 NOTE — Telephone Encounter (Signed)
I couldn't find where we contacted him after last visit.  I noticed a refill request just came in, so call and recap what we discussed and recommendations from last visit.   Make sure that you talk to the group home representative that comes with him.  1) new diagnosis of diabetes on last visit.  We started medication Metformin to help with this sugar. Hopefully he is taking this once daily and trying to eat healthier choices.  Avoid all regular soda, sweet tea, candy, cake, pie, etc.  Needs to be very limiting on alcohol too as this raising blood sugar as well.   2) we just sent refill on Carbamazepine but changed to 200mg  three times daily.  I had advised that I would increase this but cost wise, this is still the cheapest.  So needs to change to this dosing.   He was taking 2 tablets once daily.  C/t Celexa daily.   3) I had asked for referral to hepatitis clinic.   Hopefully you have started this.  If not, REFER [facility] for [facility].  4) Advised he apply for Medicaid/medicare.   5) Dr. Susann Givens sent the refill on carbamazepine and advise recheck, so ask him to f/u with him this time in a month to recheck on mood and the new diagnosis of diabetes.

## 2012-08-06 ENCOUNTER — Other Ambulatory Visit: Payer: Self-pay | Admitting: Medical

## 2012-08-06 MED ORDER — METFORMIN HCL 500 MG PO TABS: 500.0000 mg | ORAL_TABLET | Freq: Two times a day (BID) | ORAL | Status: DC

## 2012-08-06 NOTE — Telephone Encounter (Signed)
I spoke with the group home caregiver and went over the recommendations that Kristian Covey PA-C had type in detail.CLS

## 2012-08-07 ENCOUNTER — Other Ambulatory Visit: Payer: Self-pay | Admitting: Family Medicine

## 2012-08-07 NOTE — Telephone Encounter (Signed)
Is this ok?

## 2012-09-06 ENCOUNTER — Telehealth: Payer: Self-pay | Admitting: Internal Medicine

## 2012-09-07 ENCOUNTER — Other Ambulatory Visit: Payer: Self-pay | Admitting: Family Medicine

## 2012-09-07 MED ORDER — VERAPAMIL HCL 180 MG (CO) PO TB24: 180.0000 mg | ORAL_TABLET | Freq: Every day | ORAL | Status: DC

## 2012-09-07 NOTE — Telephone Encounter (Signed)
I sent the patients refill into the pharmacy. CLS

## 2012-10-04 ENCOUNTER — Other Ambulatory Visit: Payer: Self-pay | Admitting: Family Medicine

## 2012-10-04 NOTE — Telephone Encounter (Signed)
[name] this is your pt is this ok

## 2012-10-05 NOTE — Telephone Encounter (Signed)
Resent medication Carbamazepine which he should be taking 3 times daily.  F/u in 56mo on this and new diagnosis of diabetes.

## 2012-10-08 NOTE — Telephone Encounter (Signed)
I left the patient a message about his medication and a follow up appointment. CLS

## 2012-10-29 ENCOUNTER — Telehealth: Payer: Self-pay

## 2012-10-29 ENCOUNTER — Other Ambulatory Visit: Payer: Self-pay

## 2012-10-29 MED ORDER — CARBAMAZEPINE 200 MG PO TABS: 200.0000 mg | ORAL_TABLET | Freq: Two times a day (BID) | ORAL | Status: DC

## 2012-10-29 NOTE — Telephone Encounter (Signed)
Cover him until he comes in for his next exam which should be April

## 2012-10-29 NOTE — Telephone Encounter (Signed)
CALLED PT LEFT MESSAGE THAT HE NEEDS TO MAKE A DIABETIC FOLLOW UP AND THAT I COULD REFILL MED 1 MTH BUT HE NEEDS APT

## 2012-10-29 NOTE — Telephone Encounter (Signed)
FAX CAME IN FOR CARBAMAZEPINE 200 MG # 270 TAKE 1 TAB TID PLEASE ADVISE

## 2012-11-02 ENCOUNTER — Telehealth: Payer: Self-pay | Admitting: Internal Medicine

## 2012-11-05 ENCOUNTER — Other Ambulatory Visit: Payer: Self-pay | Admitting: Medical

## 2012-11-05 MED ORDER — CITALOPRAM HYDROBROMIDE 40 MG PO TABS: 40.0000 mg | ORAL_TABLET | Freq: Every day | ORAL | Status: DC

## 2012-11-06 NOTE — Telephone Encounter (Signed)
done

## 2012-11-20 ENCOUNTER — Other Ambulatory Visit: Payer: Self-pay | Admitting: Family Medicine

## 2012-12-12 ENCOUNTER — Ambulatory Visit (INDEPENDENT_AMBULATORY_CARE_PROVIDER_SITE_OTHER): Payer: Self-pay | Admitting: Medical

## 2012-12-12 ENCOUNTER — Encounter: Payer: Self-pay | Admitting: Medical

## 2012-12-12 VITALS — BP 150/82 | HR 100 | Temp 98.0°F | Resp 18 | Wt 207.0 lb

## 2012-12-12 DIAGNOSIS — F17200 Nicotine dependence, unspecified, uncomplicated: Secondary | ICD-10-CM

## 2012-12-12 DIAGNOSIS — I10 Essential (primary) hypertension: Secondary | ICD-10-CM

## 2012-12-12 DIAGNOSIS — B1920 Unspecified viral hepatitis C without hepatic coma: Secondary | ICD-10-CM

## 2012-12-12 DIAGNOSIS — F39 Unspecified mood [affective] disorder: Secondary | ICD-10-CM

## 2012-12-12 DIAGNOSIS — E119 Type 2 diabetes mellitus without complications: Secondary | ICD-10-CM

## 2012-12-12 DIAGNOSIS — S20219A Contusion of unspecified front wall of thorax, initial encounter: Secondary | ICD-10-CM

## 2012-12-12 DIAGNOSIS — F329 Major depressive disorder, single episode, unspecified: Secondary | ICD-10-CM

## 2012-12-12 DIAGNOSIS — F341 Dysthymic disorder: Secondary | ICD-10-CM

## 2012-12-12 LAB — CBC WITH DIFFERENTIAL/PLATELET
Eosinophils Absolute: 0.1 10*3/uL (ref 0.0–0.7)
Hemoglobin: 16.1 g/dL (ref 13.0–17.0)
Lymphocytes Relative: 21 % (ref 12–46)
Lymphs Abs: 1.6 10*3/uL (ref 0.7–4.0)
MCH: 32.9 pg (ref 26.0–34.0)
Monocytes Relative: 9 % (ref 3–12)
Neutro Abs: 5.3 10*3/uL (ref 1.7–7.7)
Neutrophils Relative %: 68 % (ref 43–77)
Platelets: 301 10*3/uL (ref 150–400)
RBC: 4.89 MIL/uL (ref 4.22–5.81)
WBC: 7.8 10*3/uL (ref 4.0–10.5)

## 2012-12-12 LAB — COMPREHENSIVE METABOLIC PANEL
ALT: 97 U/L — ABNORMAL HIGH (ref 0–53)
AST: 56 U/L — ABNORMAL HIGH (ref 0–37)
Albumin: 4 g/dL (ref 3.5–5.2)
CO2: 29 mEq/L (ref 19–32)
Calcium: 9.7 mg/dL (ref 8.4–10.5)
Chloride: 104 mEq/L (ref 96–112)
Creat: 0.82 mg/dL (ref 0.50–1.35)
Potassium: 5.1 mEq/L (ref 3.5–5.3)
Sodium: 140 mEq/L (ref 135–145)
Total Protein: 7.3 g/dL (ref 6.0–8.3)

## 2012-12-12 LAB — LIPID PANEL
HDL: 38 mg/dL — ABNORMAL LOW (ref >39)
Total CHOL/HDL Ratio: 4.9 Ratio
VLDL: 27 mg/dL (ref 0–40)

## 2012-12-12 MED ORDER — METFORMIN HCL 500 MG PO TABS: 500.0000 mg | ORAL_TABLET | Freq: Two times a day (BID) | ORAL | Status: DC

## 2012-12-12 MED ORDER — TRAMADOL HCL 50 MG PO TABS: 50.0000 mg | ORAL_TABLET | Freq: Three times a day (TID) | ORAL | Status: DC | PRN
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
## 2012-12-13 LAB — HEMOGLOBIN A1C
Hgb A1c MFr Bld: 5.8 % — ABNORMAL HIGH (ref <5.7)
Mean Plasma Glucose: 120 mg/dL — ABNORMAL HIGH (ref <117)

## 2012-12-13 LAB — TSH: TSH: 1.528 u[IU]/mL (ref 0.350–4.500)

## 2012-12-13 LAB — RPR

## 2012-12-13 LAB — T4: T4, Total: 9.5 ug/dL (ref 5.0–12.5)

## 2012-12-14 ENCOUNTER — Other Ambulatory Visit: Payer: Self-pay | Admitting: Medical

## 2012-12-18 ENCOUNTER — Telehealth: Payer: Self-pay | Admitting: Family Medicine

## 2012-12-18 NOTE — Telephone Encounter (Signed)
Message copied by Janeice Robinson on Tue Dec 18, 2012  3:25 PM ------      Message from: Jac Canavan      Created: Tue Dec 18, 2012  1:20 PM       After speaking with Dr. Susann Givens, we are not sure what other medications to pursue instead of Tegretol.   At this point, I would recommend a visit with psychiatry to help with medication regimen.               pls either refer or have them set up appt for psychiatry - such as Dr. Ann Maki [facility]'s office, [facility], [place], etc. ------

## 2012-12-18 NOTE — Telephone Encounter (Signed)
LMOM TO CB. X 2 CLS 

## 2012-12-26 NOTE — Telephone Encounter (Signed)
lmom to cb. cls 

## 2012-12-26 NOTE — Telephone Encounter (Signed)
I spoke with the patient and I spoke with the care giver and the caregiver said they can't afford to go see a psychiatry. They want to know can you up the medications that he is on because he needs something to adjust his mood or he will have to kick him out. CLS

## 2012-12-28 ENCOUNTER — Other Ambulatory Visit: Payer: Self-pay | Admitting: Medical

## 2012-12-28 MED ORDER — BUPROPION HCL 100 MG PO TABS: 100.0000 mg | ORAL_TABLET | Freq: Two times a day (BID) | ORAL | Status: DC

## 2012-12-28 NOTE — Telephone Encounter (Signed)
although I will work with him, he is going to have to go to social services to get on medicare, and he will need to get appt with psychiatry.     For now, I am adding on Wellbutrin 100mg  BID to his regimen.  C/t Carbamezapine, c/t Celexa as well.

## 2012-12-28 NOTE — Telephone Encounter (Signed)
Patient's caregiver is aware of the change in the medication. CLS The caregiver said thank you Kristian Covey PA-C. CLS

## 2012-12-28 NOTE — Telephone Encounter (Signed)
LMOM TO CB. CLS 

## 2013-02-04 ENCOUNTER — Other Ambulatory Visit: Payer: Self-pay | Admitting: Medical

## 2013-02-04 NOTE — Telephone Encounter (Signed)
Is it okay to refill? CLS

## 2013-02-05 NOTE — Telephone Encounter (Signed)
Refilled medication, but have him return regarding the Wellbutrin we added last visit

## 2013-02-05 NOTE — Telephone Encounter (Signed)
I left a message on the voicemail for him to call and schedule that follow up appointment. CLS

## 2013-03-09 ENCOUNTER — Other Ambulatory Visit: Payer: Self-pay | Admitting: Medical

## 2013-03-17 ENCOUNTER — Other Ambulatory Visit: Payer: Self-pay | Admitting: Medical

## 2013-03-31 ENCOUNTER — Other Ambulatory Visit: Payer: Self-pay | Admitting: Family Medicine

## 2013-04-02 NOTE — Telephone Encounter (Signed)
I think this is your pt

## 2013-04-03 NOTE — Telephone Encounter (Signed)
I HAVE CALLED PT WITH NO ANSWER

## 2013-04-03 NOTE — Telephone Encounter (Signed)
Pls call patient/caregiver.  I received refill request on medication for mood.  He is currently on 3 medications and at last visit wasn't doing so well with mood.    Per last correspondance, has he went to social services to apply for medicare?  If not, why not?   Has he made an appt with psychiatry?

## 2013-04-03 NOTE — Telephone Encounter (Signed)
I spoke with the caregiver and he said that [name] was doing fine. His mood is fine. He is doing fine with the medications. He did go to social services and they told him he was not at the right place and they sent him somewhere but nothing has been taken care of yet. No, he has not seen the psychiatry because they can;'t afford it. CLS

## 2013-04-04 NOTE — Telephone Encounter (Signed)
refilll

## 2013-04-10 ENCOUNTER — Telehealth: Payer: Self-pay | Admitting: Internal Medicine

## 2013-04-10 NOTE — Telephone Encounter (Signed)
Medication sent 03/13/13.  pls verify that it went to pharmacy.

## 2013-04-10 NOTE — Telephone Encounter (Signed)
Pt needs a refill on carbamazepine 200mg  #270 for a 90 day supply to [address].

## 2013-04-11 MED ORDER — CARBAMAZEPINE 200 MG PO TABS: ORAL_TABLET | ORAL | Status: DC

## 2013-04-11 NOTE — Telephone Encounter (Signed)
It was sent but pt cancelled it cause it was expensive and they wanted it sent to a new pharmacy. i have sent to new pharmacy

## 2013-04-15 ENCOUNTER — Other Ambulatory Visit: Payer: Self-pay | Admitting: Medical

## 2013-05-05 ENCOUNTER — Other Ambulatory Visit: Payer: Self-pay | Admitting: Medical

## 2013-05-06 NOTE — Telephone Encounter (Signed)
Is this okay to refill? 

## 2013-05-24 ENCOUNTER — Other Ambulatory Visit: Payer: Self-pay | Admitting: Medical

## 2013-05-24 NOTE — Telephone Encounter (Signed)
Is this okay to refill? 

## 2013-05-25 NOTE — Telephone Encounter (Signed)
Time for recheck/OV.  Bring all pill bottles.

## 2013-05-27 NOTE — Telephone Encounter (Signed)
Left message on voicemail to check and appt

## 2013-05-28 ENCOUNTER — Encounter: Payer: Self-pay | Admitting: Medical

## 2013-05-28 ENCOUNTER — Ambulatory Visit (INDEPENDENT_AMBULATORY_CARE_PROVIDER_SITE_OTHER): Payer: Self-pay | Admitting: Medical

## 2013-05-28 VITALS — BP 128/78 | HR 92 | Temp 97.7°F | Resp 18 | Wt 205.0 lb

## 2013-05-28 DIAGNOSIS — I10 Essential (primary) hypertension: Secondary | ICD-10-CM

## 2013-05-28 DIAGNOSIS — F39 Unspecified mood [affective] disorder: Secondary | ICD-10-CM

## 2013-05-28 DIAGNOSIS — E669 Obesity, unspecified: Secondary | ICD-10-CM

## 2013-05-28 DIAGNOSIS — F329 Major depressive disorder, single episode, unspecified: Secondary | ICD-10-CM

## 2013-05-28 DIAGNOSIS — E119 Type 2 diabetes mellitus without complications: Secondary | ICD-10-CM

## 2013-05-28 DIAGNOSIS — B1920 Unspecified viral hepatitis C without hepatic coma: Secondary | ICD-10-CM

## 2013-05-28 DIAGNOSIS — Z9119 Patient's noncompliance with other medical treatment and regimen: Secondary | ICD-10-CM

## 2013-05-28 DIAGNOSIS — E785 Hyperlipidemia, unspecified: Secondary | ICD-10-CM

## 2013-05-28 LAB — POCT GLYCOSYLATED HEMOGLOBIN (HGB A1C): Hemoglobin A1C: 5.8

## 2013-05-28 LAB — POCT UA - MICROALBUMIN: Albumin/Creatinine Ratio, Urine, POC: 12.3

## 2013-05-28 MED ORDER — CITALOPRAM HYDROBROMIDE 40 MG PO TABS: 40.0000 mg | ORAL_TABLET | Freq: Every day | ORAL | Status: DC

## 2013-05-28 MED ORDER — BUPROPION HCL 100 MG PO TABS: 100.0000 mg | ORAL_TABLET | Freq: Two times a day (BID) | ORAL | Status: DC

## 2013-05-28 MED ORDER — VERAPAMIL HCL ER 180 MG PO TBCR: 180.0000 mg | EXTENDED_RELEASE_TABLET | Freq: Every day | ORAL | Status: DC

## 2013-05-28 MED ORDER — ATORVASTATIN CALCIUM 20 MG PO TABS: 20.0000 mg | ORAL_TABLET | Freq: Every day | ORAL | Status: DC

## 2013-05-28 NOTE — Progress Notes (Signed)
Subjective: Here for general f/u.  Accompanied by his caretaker.  Been doing well in general.  He notes that he recently went on a 10 day vacation with his caretaker to [place], [place] and [place]. [place].  He had a great time at [employer].   He is going with his son to 2 concerts upcoming.  He plans to see [employer], and 2 other metal bands in October and November.    He and his caretaker said his mood has been pretty good in the last several months. He thinks things are better after starting the Wellbutrin. He continues to take carbamazepine 3 times a day, Celexa as well. He has cut out alcohol altogether. His caretaker bought a safe, and all alcohol products including cooking alcohol is locked up. He is compliant with his metformin twice a day. He is not checking his sugars he is trying use some discretion or portion sizes. However he notes that he does eat a lot of cheese, he can't control himself with hotdog and cheeseburgers and will just keep eating. He does still smoke, has no plans to quit smoking.  He is compliant with all of his other medications without complaint.  He has still not went to see hepatitis clinic.  He has still not went to SSI/DSS to pursue medicaid/medicare insurance.  No good reason that he hasn't pursued it.   He feels like the wax in his ear is impacted, wants to wait for next time to have this flushed out.  However he has a new skin lesion beside his left scrotum that he wants looked at. First noticed this probably year ago, but it has gotten a little bigger.  Past Medical History  Diagnosis Date  . COPD (chronic obstructive pulmonary disease)   . Hypertension   . Tobacco use disorder   . Depression   . Hepatitis C   . Arthritis   . Obsessive compulsive disorder   . Obesity   . Mood disorder   . Diabetes mellitus without complication 12/13    Objective: BP 128/78  Pulse 92  Temp(Src) 97.7 F (36.5 C) (Oral)  Resp 18  Wt 205 lb  (92.987 kg)  BMI 31.18 kg/m2  General appearance: alert, no distress, WD/WN Skin: right inguinal region with brownish/purple 2cm x 1.5cm raised somewhat fleshy/warty lesion Oral cavity: MMM, no lesions Neck: supple, no lymphadenopathy, no thyromegaly, no masses Heart: RRR, normal S1, S2, no murmurs Lungs: CTA bilaterally, no wheezes, rhonchi, or rales Abdomen: +bs, soft, non tender, non distended, no masses, no hepatomegaly, no splenomegaly Pulses: 2+ symmetric, upper and lower extremities, normal cap refill Ext: no edema   Assessment: Encounter Diagnoses  Name Primary?  . Type II or unspecified type diabetes mellitus without mention of complication, not stated as uncontrolled Yes  . Dyslipidemia (high LDL; low HDL)   . Essential hypertension, benign   . Depression   . Mood disorder   . Obesity, unspecified   . Hepatitis C   . Noncompliance    Plan: DM type II - advised he do better with his diet, have a little more control, but continue same medication. Hemoglobin A1c was 5.8% today. Dyslipidemia-reviewing his prior labs, I decided to start him on Lipitor today in light of his diabetes diagnosis HTN - controlled on current medication Depression, mood disorder-seems to be doing well at this time on current regimen, he is not drinking alcohol at the moment Obesity-advise he continue work on weight loss measures Hepatitis C,  noncompliance-he still has not went to hepatitis clinic, and I can't seem to get him to do this.  We have suggested numerous times for him to go, to also look into Medicare/other insurance options  He declines flu shot today. I had supervising physician Dr. Susann Givens look at his skin lesion, appears to be seborrheic keratosis, and the decision was made to leave this alone F/u 80mo

## 2013-06-25 ENCOUNTER — Other Ambulatory Visit: Payer: Self-pay | Admitting: Medical

## 2013-06-26 NOTE — Telephone Encounter (Signed)
Is this okay to refill? 

## 2013-07-16 ENCOUNTER — Other Ambulatory Visit: Payer: Self-pay | Admitting: Medical

## 2013-07-17 ENCOUNTER — Other Ambulatory Visit: Payer: Self-pay | Admitting: Medical

## 2013-07-22 ENCOUNTER — Other Ambulatory Visit: Payer: Self-pay | Admitting: Family Medicine

## 2013-07-22 MED ORDER — CARBAMAZEPINE 200 MG PO TABS: 200.0000 mg | ORAL_TABLET | Freq: Three times a day (TID) | ORAL | Status: DC

## 2013-09-04 ENCOUNTER — Other Ambulatory Visit: Payer: Self-pay | Admitting: Medical

## 2013-09-05 ENCOUNTER — Other Ambulatory Visit: Payer: Self-pay | Admitting: Medical

## 2013-09-21 ENCOUNTER — Other Ambulatory Visit: Payer: Self-pay | Admitting: Medical

## 2013-10-20 ENCOUNTER — Other Ambulatory Visit: Payer: Self-pay | Admitting: Medical

## 2013-10-21 ENCOUNTER — Other Ambulatory Visit: Payer: Self-pay | Admitting: Medical

## 2013-11-15 ENCOUNTER — Other Ambulatory Visit: Payer: Self-pay | Admitting: Medical

## 2013-11-18 ENCOUNTER — Other Ambulatory Visit: Payer: Self-pay | Admitting: Medical

## 2013-11-18 ENCOUNTER — Telehealth: Payer: Self-pay | Admitting: Medical

## 2013-11-18 NOTE — Telephone Encounter (Signed)
pls handle the refill.  It came directly from the pharmacy but had error in the link

## 2014-03-03 ENCOUNTER — Other Ambulatory Visit: Payer: Self-pay | Admitting: Medical

## 2014-03-04 NOTE — Telephone Encounter (Signed)
Is this okay to refill? 

## 2014-03-06 ENCOUNTER — Other Ambulatory Visit: Payer: Self-pay | Admitting: Medical

## 2014-03-06 ENCOUNTER — Telehealth: Payer: Self-pay | Admitting: Internal Medicine

## 2014-03-06 MED ORDER — BUPROPION HCL 100 MG PO TABS: 100.0000 mg | ORAL_TABLET | Freq: Two times a day (BID) | ORAL | Status: DC

## 2014-03-06 NOTE — Telephone Encounter (Signed)
Pt has an appt at the end of this month and he needs a refill on bupropion 100mg  to [facility] [address] to get to his appt

## 2014-03-06 NOTE — Telephone Encounter (Signed)
[name], ok to refill?

## 2014-03-27 ENCOUNTER — Encounter: Payer: Self-pay | Admitting: Medical

## 2014-03-27 ENCOUNTER — Telehealth: Payer: Self-pay | Admitting: Medical

## 2014-03-27 ENCOUNTER — Ambulatory Visit (INDEPENDENT_AMBULATORY_CARE_PROVIDER_SITE_OTHER): Payer: Self-pay | Admitting: Medical

## 2014-03-27 VITALS — BP 140/90 | HR 100 | Temp 97.6°F | Resp 16 | Wt 200.0 lb

## 2014-03-27 DIAGNOSIS — B351 Tinea unguium: Secondary | ICD-10-CM

## 2014-03-27 DIAGNOSIS — B182 Chronic viral hepatitis C: Secondary | ICD-10-CM

## 2014-03-27 DIAGNOSIS — J449 Chronic obstructive pulmonary disease, unspecified: Secondary | ICD-10-CM

## 2014-03-27 DIAGNOSIS — H6120 Impacted cerumen, unspecified ear: Secondary | ICD-10-CM

## 2014-03-27 DIAGNOSIS — F0630 Mood disorder due to known physiological condition, unspecified: Secondary | ICD-10-CM

## 2014-03-27 DIAGNOSIS — F329 Major depressive disorder, single episode, unspecified: Secondary | ICD-10-CM

## 2014-03-27 DIAGNOSIS — F17200 Nicotine dependence, unspecified, uncomplicated: Secondary | ICD-10-CM

## 2014-03-27 DIAGNOSIS — F3289 Other specified depressive episodes: Secondary | ICD-10-CM

## 2014-03-27 DIAGNOSIS — H6123 Impacted cerumen, bilateral: Secondary | ICD-10-CM

## 2014-03-27 DIAGNOSIS — F32A Depression, unspecified: Secondary | ICD-10-CM

## 2014-03-27 DIAGNOSIS — I10 Essential (primary) hypertension: Secondary | ICD-10-CM

## 2014-03-27 DIAGNOSIS — E785 Hyperlipidemia, unspecified: Secondary | ICD-10-CM | POA: Insufficient documentation

## 2014-03-27 DIAGNOSIS — E119 Type 2 diabetes mellitus without complications: Secondary | ICD-10-CM | POA: Insufficient documentation

## 2014-03-27 LAB — CBC
HCT: 46.8 % (ref 39.0–52.0)
Hemoglobin: 16.1 g/dL (ref 13.0–17.0)
MCH: 32.9 pg (ref 26.0–34.0)
MCHC: 34.4 g/dL (ref 30.0–36.0)
MCV: 95.7 fL (ref 78.0–100.0)
PLATELETS: 241 10*3/uL (ref 150–400)
RBC: 4.89 MIL/uL (ref 4.22–5.81)
RDW: 14.8 % (ref 11.5–15.5)
WBC: 6.7 10*3/uL (ref 4.0–10.5)

## 2014-03-27 LAB — POCT GLYCOSYLATED HEMOGLOBIN (HGB A1C): HEMOGLOBIN A1C: 5.6

## 2014-03-27 MED ORDER — ATORVASTATIN CALCIUM 20 MG PO TABS: 20.0000 mg | ORAL_TABLET | Freq: Every day | ORAL | Status: DC

## 2014-03-27 MED ORDER — CITALOPRAM HYDROBROMIDE 40 MG PO TABS: 40.0000 mg | ORAL_TABLET | Freq: Every day | ORAL | Status: DC

## 2014-03-27 MED ORDER — OMEPRAZOLE 20 MG PO CPDR: 20.0000 mg | DELAYED_RELEASE_CAPSULE | Freq: Every day | ORAL | Status: DC

## 2014-03-27 MED ORDER — VERAPAMIL HCL ER 180 MG PO TBCR: 180.0000 mg | EXTENDED_RELEASE_TABLET | Freq: Every day | ORAL | Status: DC

## 2014-03-27 MED ORDER — BUPROPION HCL 100 MG PO TABS: 100.0000 mg | ORAL_TABLET | Freq: Two times a day (BID) | ORAL | Status: DC

## 2014-03-27 MED ORDER — CARBAMAZEPINE 200 MG PO TABS: 200.0000 mg | ORAL_TABLET | Freq: Three times a day (TID) | ORAL | Status: DC

## 2014-03-27 MED ORDER — LISINOPRIL-HYDROCHLOROTHIAZIDE 20-12.5 MG PO TABS: 1.0000 | ORAL_TABLET | Freq: Every day | ORAL | Status: DC

## 2014-03-27 MED ORDER — METFORMIN HCL 500 MG PO TABS: 500.0000 mg | ORAL_TABLET | Freq: Two times a day (BID) | ORAL | Status: DC

## 2014-03-27 NOTE — Telephone Encounter (Signed)
Can you please check into the hepatitis clinic to see if we can somehow have him seen for consult on chronic Hep C.   He has no insurance, and so far he has not apparently had an appointment there although I have urged him to been seen.  I thought we referred prior, but I don't see any prior consult notes.   Please check first that we don't already have some documentation on a consult.  If not, refer to Hepatitis clinic.

## 2014-03-27 NOTE — Progress Notes (Signed)
Subjective:   [name] is a 53 y.o. male presenting on 03/27/2014 with ear pain/pressure  [name] is a pleasant interesting gentleman to is compliant with medications, somewhat compliant with diet, but will likely never stop smoking.  He continues to be self-pay although we have recommended numerous times he go apply for Medicaid or other financial support.  We have also recommended numerous times he go to hepatitis clinic for history of hepatitis C, but he is not done this in the 2 or 3 years I've known him.   Overall today he says he feels good, mood has been great, seems to be happy most the time. Still living at the group home.  Not exercising and not planning too.  Drinking some beer at [employer], but otherwise not drinking daily.  Has a history of drug abuse in the past but none in years.  Continues to smoke and enjoys smoking, has no plans to ever quit.  Denies any current breathing issues.  Since his caregiver has diabetes, they have been eating somewhat healthier around the house, but he does love pizza and cheeseburgers. He is compliant with all of his medications.  His only complaint today is ears feel plugged, and he has had a history of impacted cerumen with ear lavage in the past  No other complaint.  Review of Systems ROS as in subjective      Objective:   BP 140/90  Pulse 100  Temp(Src) 97.6 F (36.4 C) (Oral)  Resp 16  Wt 200 lb (90.719 kg)  BP Readings from Last 3 Encounters:  03/27/14 140/90  05/28/13 128/78  12/12/12 150/82   Wt Readings from Last 3 Encounters:  03/27/14 200 lb (90.719 kg)  05/28/13 205 lb (92.987 kg)  12/12/12 207 lb (93.895 kg)    General appearance: alert, no distress, WD/WN, obese white male HEENT: normocephalic, sclerae anicteric, bilat ear canals with impacted cerumen Oral cavity: MMM, no lesions Neck: supple, no lymphadenopathy, no thyromegaly, no masses, no bruits Heart: RRR, normal S1, S2, no murmurs Lungs: CTA  bilaterally, no wheezes, rhonchi, or rales Abdomen: +bs, soft, non tender, non distended, no masses, no hepatomegaly, no splenomegaly Pulses: 2+ symmetric, upper and lower extremities, normal cap refill Ext: no edema Foot exam: see separate note     Assessment: Encounter Diagnoses  Name Primary?  . Depression Yes  . Mood disorder in conditions classified elsewhere   . Type II or unspecified type diabetes mellitus without mention of complication, not stated as uncontrolled   . Essential hypertension, benign   . Smoker   . Hyperlipidemia   . Impacted cerumen of both ears   . Onychomycosis   . Chronic hepatitis C without hepatic coma   . Chronic obstructive pulmonary disease, unspecified COPD, unspecified chronic bronchitis type      Plan: Depression, mood disorder - seems to be doing well on current regimen.  Counseled to limit alcohol use.   DM type II - continue current medication, try and lose weight, try and eat healthy diet, recommend he exercise more than he is doing  HTN - he had forgot to take medication a few days.  Instead of adding medications today, I asked him to try and lose more weight  Smoker - no interest in quitting despite the recommendations to quit  Hyperlipidemia - labs today, likely continue same medication  Chronic hep C - at least in a couple years I've known him they have not established with hepatitis clinic  despite recommendations  COPD - currently asymptomatic, no medication prior  Impacted cerumen - Discussed findings.  Discussed risk/benefits of procedure and patient agrees to procedure. Successfully used warm water lavage to remove impacted cerumen from bilat ear canal. Patient tolerated procedure well. Advised they avoid using any cotton swabs or other devices to clean the ear canals.  Use basic hygiene as discussed.  Follow up prn.   He declines flu vaccine today.  [name] was seen today for ear pain/pressure.  Diagnoses and associated orders  for this visit:  Depression  Mood disorder in conditions classified elsewhere  Type II or unspecified type diabetes mellitus without mention of complication, not stated as uncontrolled - Comprehensive metabolic panel - CBC - HgB A1c - [facility] DIABETES FOOT EXAM  Essential hypertension, benign  Smoker  Hyperlipidemia - Lipid panel  Impacted cerumen of both ears  Onychomycosis  Chronic hepatitis C without hepatic coma  Chronic obstructive pulmonary disease, unspecified COPD, unspecified chronic bronchitis type    Return pending labs.

## 2014-03-28 LAB — COMPREHENSIVE METABOLIC PANEL
ALT: 221 U/L — ABNORMAL HIGH (ref 0–53)
AST: 99 U/L — ABNORMAL HIGH (ref 0–37)
Albumin: 4.3 g/dL (ref 3.5–5.2)
Alkaline Phosphatase: 76 U/L (ref 39–117)
BUN: 19 mg/dL (ref 6–23)
CALCIUM: 9.7 mg/dL (ref 8.4–10.5)
CHLORIDE: 105 meq/L (ref 96–112)
CO2: 30 mEq/L (ref 19–32)
Creat: 0.84 mg/dL (ref 0.50–1.35)
Glucose, Bld: 201 mg/dL — ABNORMAL HIGH (ref 70–99)
Potassium: 5.5 mEq/L — ABNORMAL HIGH (ref 3.5–5.3)
Sodium: 139 mEq/L (ref 135–145)
Total Bilirubin: 0.5 mg/dL (ref 0.2–1.2)
Total Protein: 7.2 g/dL (ref 6.0–8.3)

## 2014-03-28 LAB — LIPID PANEL
CHOLESTEROL: 198 mg/dL (ref 0–200)
HDL: 41 mg/dL (ref >39)
LDL Cholesterol: 123 mg/dL — ABNORMAL HIGH (ref 0–99)
Total CHOL/HDL Ratio: 4.8 Ratio
Triglycerides: 172 mg/dL — ABNORMAL HIGH (ref <150)
VLDL: 34 mg/dL (ref 0–40)

## 2014-03-28 NOTE — Telephone Encounter (Signed)
i FAX EVERYTHING OVER TO [facility] CLINIC FOR THE PATIENT TO GET AN APPOINTMENT. CLS

## 2014-03-31 ENCOUNTER — Other Ambulatory Visit: Payer: Self-pay | Admitting: Medical

## 2014-03-31 ENCOUNTER — Other Ambulatory Visit: Payer: Self-pay | Admitting: Family Medicine

## 2014-03-31 MED ORDER — ATORVASTATIN CALCIUM 20 MG PO TABS: 20.0000 mg | ORAL_TABLET | Freq: Every day | ORAL | Status: DC

## 2014-09-12 ENCOUNTER — Other Ambulatory Visit: Payer: Self-pay | Admitting: Medical

## 2014-09-15 NOTE — Telephone Encounter (Signed)
Are these okay to refill? 

## 2014-09-16 NOTE — Telephone Encounter (Signed)
Needs appt

## 2014-09-30 ENCOUNTER — Encounter: Payer: Self-pay | Admitting: Medical

## 2014-09-30 ENCOUNTER — Ambulatory Visit (INDEPENDENT_AMBULATORY_CARE_PROVIDER_SITE_OTHER): Payer: Self-pay | Admitting: Medical

## 2014-09-30 VITALS — BP 98/60 | HR 93 | Temp 98.4°F | Resp 16 | Wt 181.0 lb

## 2014-09-30 DIAGNOSIS — R634 Abnormal weight loss: Secondary | ICD-10-CM

## 2014-09-30 DIAGNOSIS — L609 Nail disorder, unspecified: Secondary | ICD-10-CM

## 2014-09-30 DIAGNOSIS — I10 Essential (primary) hypertension: Secondary | ICD-10-CM

## 2014-09-30 DIAGNOSIS — L608 Other nail disorders: Secondary | ICD-10-CM

## 2014-09-30 DIAGNOSIS — IMO0002 Reserved for concepts with insufficient information to code with codable children: Secondary | ICD-10-CM

## 2014-09-30 DIAGNOSIS — E1165 Type 2 diabetes mellitus with hyperglycemia: Secondary | ICD-10-CM

## 2014-09-30 DIAGNOSIS — B182 Chronic viral hepatitis C: Secondary | ICD-10-CM

## 2014-09-30 DIAGNOSIS — E1169 Type 2 diabetes mellitus with other specified complication: Secondary | ICD-10-CM

## 2014-09-30 DIAGNOSIS — G4700 Insomnia, unspecified: Secondary | ICD-10-CM

## 2014-09-30 DIAGNOSIS — F329 Major depressive disorder, single episode, unspecified: Secondary | ICD-10-CM

## 2014-09-30 DIAGNOSIS — F17200 Nicotine dependence, unspecified, uncomplicated: Secondary | ICD-10-CM

## 2014-09-30 DIAGNOSIS — B353 Tinea pedis: Secondary | ICD-10-CM | POA: Insufficient documentation

## 2014-09-30 DIAGNOSIS — F32A Depression, unspecified: Secondary | ICD-10-CM

## 2014-09-30 DIAGNOSIS — Z720 Tobacco use: Secondary | ICD-10-CM

## 2014-09-30 DIAGNOSIS — E785 Hyperlipidemia, unspecified: Secondary | ICD-10-CM

## 2014-09-30 DIAGNOSIS — F0630 Mood disorder due to known physiological condition, unspecified: Secondary | ICD-10-CM

## 2014-09-30 LAB — HEPATIC FUNCTION PANEL
ALT: 62 U/L — ABNORMAL HIGH (ref 0–53)
AST: 56 U/L — ABNORMAL HIGH (ref 0–37)
Albumin: 2.8 g/dL — ABNORMAL LOW (ref 3.5–5.2)
Alkaline Phosphatase: 91 U/L (ref 39–117)
BILIRUBIN DIRECT: 0.3 mg/dL (ref 0.0–0.3)
BILIRUBIN INDIRECT: 0.3 mg/dL (ref 0.2–1.2)
BILIRUBIN TOTAL: 0.6 mg/dL (ref 0.2–1.2)
Total Protein: 6 g/dL (ref 6.0–8.3)

## 2014-09-30 LAB — CBC
HCT: 41.6 % (ref 39.0–52.0)
HEMOGLOBIN: 13.8 g/dL (ref 13.0–17.0)
MCH: 30.7 pg (ref 26.0–34.0)
MCHC: 33.2 g/dL (ref 30.0–36.0)
MCV: 92.4 fL (ref 78.0–100.0)
MPV: 9.4 fL (ref 8.6–12.4)
Platelets: 520 10*3/uL — ABNORMAL HIGH (ref 150–400)
RBC: 4.5 MIL/uL (ref 4.22–5.81)
RDW: 13.6 % (ref 11.5–15.5)
WBC: 20.8 10*3/uL — AB (ref 4.0–10.5)

## 2014-09-30 LAB — BASIC METABOLIC PANEL
BUN: 17 mg/dL (ref 6–23)
CHLORIDE: 98 meq/L (ref 96–112)
CO2: 26 mEq/L (ref 19–32)
Calcium: 8.2 mg/dL — ABNORMAL LOW (ref 8.4–10.5)
Creat: 0.89 mg/dL (ref 0.50–1.35)
Glucose, Bld: 138 mg/dL — ABNORMAL HIGH (ref 70–99)
POTASSIUM: 4.5 meq/L (ref 3.5–5.3)
SODIUM: 134 meq/L — AB (ref 135–145)

## 2014-09-30 LAB — HEMOGLOBIN A1C
HEMOGLOBIN A1C: 6.5 % — AB (ref <5.7)
Mean Plasma Glucose: 140 mg/dL — ABNORMAL HIGH (ref <117)

## 2014-09-30 LAB — TSH: TSH: 0.657 u[IU]/mL (ref 0.350–4.500)

## 2014-09-30 LAB — LIPID PANEL
Cholesterol: 146 mg/dL (ref 0–200)
HDL: 15 mg/dL — ABNORMAL LOW (ref >=40)
LDL CALC: 89 mg/dL (ref 0–99)
Total CHOL/HDL Ratio: 9.7 Ratio
Triglycerides: 209 mg/dL — ABNORMAL HIGH (ref <150)
VLDL: 42 mg/dL — ABNORMAL HIGH (ref 0–40)

## 2014-09-30 NOTE — Progress Notes (Signed)
Subjective:   [name] is a 54 y.o. male presenting on 09/30/2014 with HAD A KNOT NEAR HIS EAR NOT SURE IF IT IS THERE OR NOT  [name] is a pleasant interesting gentleman who is compliant with medications, somewhat compliant with diet, but will likely never stop smoking.  He continues to be self-pay although we have recommended numerous times he go apply for Medicaid or other financial support.  We have also recommended numerous times he go to hepatitis clinic for history of hepatitis C, but he is not done this in the 2 or 3 years I've known him.   Overall today he says he feels good, mood has been great, seems to be happy most the time. Still living at the group home.  Not exercising and not planning too.  No recent ETOH intake.  Has a history of drug abuse and  ETOH abuse in the past but none in years.  Continues to smoke and enjoys smoking, has no plans to ever quit.  Denies any current breathing issues.  Since his caregiver has diabetes, they try to have healthy foods around the house.  He is extreme all or nothing.  He lately has been eating nothing to "control his diabetes."  At other times he binges on food, particularly sphaghetti and pizza.  He has lost weight since last visit somewhat intentional.  Denies blood in stool or urine, no night sweats ,no fevers, no respiratory symptoms.    Mood has been good.  Sleep not so good, chronically.  He took 3 Wellbutrin last night to help with sleep and this made him dizzy.  No other complaint.  Review of Systems ROS as in subjective      Objective:   BP 98/60 mmHg  Pulse 93  Temp(Src) 98.4 F (36.9 C) (Oral)  Resp 16  Wt 181 lb (82.101 kg)  BP Readings from Last 3 Encounters:  09/30/14 98/60  03/27/14 140/90  05/28/13 128/78   Wt Readings from Last 3 Encounters:  09/30/14 181 lb (82.101 kg)  03/27/14 200 lb (90.719 kg)  05/28/13 205 lb (92.987 kg)    General appearance: alert, no distress, WD/WN, obese white  male Skin: few faint telangectasia's of chest HEENT: normocephalic, sclerae anicteric, unremarkable otherwise Oral cavity: MMM, no lesions, missing several teeth. Neck: supple, no lymphadenopathy, no thyromegaly, no masses, no bruits Heart: RRR, normal S1, S2, no murmurs Lungs: CTA bilaterally, no wheezes, rhonchi, or rales Abdomen: +bs, soft, non tender, non distended, no masses, no hepatomegaly, no splenomegaly Pulses: 2+ symmetric, upper and lower extremities, normal cap refill Ext: no edema Foot exam: see separate note     Assessment: Encounter Diagnoses  Name Primary?  . Diabetes type 2, uncontrolled Yes  . Essential hypertension   . Hyperlipidemia   . Depression   . Mood disorder in conditions classified elsewhere   . Insomnia   . Toenail deformity   . Tinea pedis, unspecified laterality   . Loss of weight   . Smoker   . Chronic hepatitis C without hepatic coma      Plan: Labs today, routine and given the loss of weight. Discussed hygiene doing better at taking a bath every day and washing his feet daily foot checks daily.  Depression, mood disorder - seems to be doing well on current regimen.  Counseled to limit alcohol use.   DM type II - continue current medication, eat healthy diet, recommend he exercise more than he is doing. Follow-up pending  labs  HTN - continue current medication   Smoker - no interest in quitting despite the recommendations to quit  Hyperlipidemia - labs today, likely continue same medication  Chronic hep C - at least in a couple years I've known him they have not established with hepatitis clinic despite recommendations  Insomnia-follow-up pending labs will make additional recommendations. Discussed sleep hygiene not taking daytime naps and trying to improve sleep environment  Tinea pedis - restart Lamisil   Dao was seen today for had a knot near his ear not sure if it is there or not.  Diagnoses and all orders for this  visit:  Diabetes type 2, uncontrolled Orders: -     Lipid panel -     CBC -     TSH -     Basic metabolic panel -     Hepatic Function Panel -     Hemoglobin A1c  Essential hypertension Orders: -     Basic metabolic panel -     Hepatic Function Panel  Hyperlipidemia Orders: -     Basic metabolic panel -     Hepatic Function Panel  Depression  Mood disorder in conditions classified elsewhere  Insomnia  Toenail deformity  Tinea pedis, unspecified laterality  Loss of weight  Smoker  Chronic hepatitis C without hepatic coma Orders: -     Basic metabolic panel -     Hepatic Function Panel   Return pending labs.

## 2014-10-01 ENCOUNTER — Other Ambulatory Visit: Payer: Self-pay | Admitting: Medical

## 2014-10-01 ENCOUNTER — Telehealth: Payer: Self-pay | Admitting: Medical

## 2014-10-01 DIAGNOSIS — D72829 Elevated white blood cell count, unspecified: Secondary | ICD-10-CM

## 2014-10-01 DIAGNOSIS — F17200 Nicotine dependence, unspecified, uncomplicated: Secondary | ICD-10-CM

## 2014-10-01 DIAGNOSIS — R634 Abnormal weight loss: Secondary | ICD-10-CM

## 2014-10-01 MED ORDER — LISINOPRIL-HYDROCHLOROTHIAZIDE 20-12.5 MG PO TABS: 1.0000 | ORAL_TABLET | Freq: Every day | ORAL | Status: DC

## 2014-10-01 MED ORDER — CARBAMAZEPINE 200 MG PO TABS: 200.0000 mg | ORAL_TABLET | Freq: Three times a day (TID) | ORAL | Status: DC

## 2014-10-01 MED ORDER — METFORMIN HCL 500 MG PO TABS: 500.0000 mg | ORAL_TABLET | Freq: Two times a day (BID) | ORAL | Status: DC

## 2014-10-01 MED ORDER — CITALOPRAM HYDROBROMIDE 40 MG PO TABS: 40.0000 mg | ORAL_TABLET | Freq: Every day | ORAL | Status: DC

## 2014-10-01 MED ORDER — OMEPRAZOLE 20 MG PO CPDR: 20.0000 mg | DELAYED_RELEASE_CAPSULE | Freq: Every day | ORAL | Status: DC

## 2014-10-01 MED ORDER — BUPROPION HCL 100 MG PO TABS: 100.0000 mg | ORAL_TABLET | Freq: Two times a day (BID) | ORAL | Status: DC

## 2014-10-01 MED ORDER — ATORVASTATIN CALCIUM 20 MG PO TABS: 20.0000 mg | ORAL_TABLET | Freq: Every day | ORAL | Status: DC

## 2014-10-01 NOTE — Telephone Encounter (Signed)
chandra - if he is agreeable, refer to [facility] for consideration of their free colonoscopy promotion for March.  He has not insurance and needs a colonoscopy.  Beverlee Nims has the contact info

## 2014-10-01 NOTE — Telephone Encounter (Signed)
Abigail Butts, can you try and call this patient. I ran out of time thank you

## 2014-10-02 NOTE — Telephone Encounter (Signed)
LM to [facility] 

## 2014-10-02 NOTE — Progress Notes (Signed)
LM to CB

## 2014-10-02 NOTE — Telephone Encounter (Signed)
Reported all to Boley. He will take patient to get chest xray tomorrow. Labs all reported and information provided regarding colonoscopy. He will check with patient and see if he will agree to colonoscopy. If he agrees he will let us know so we can start the referral process which apparently Beverlee Nims knows about.

## 2014-10-06 ENCOUNTER — Other Ambulatory Visit: Payer: Self-pay | Admitting: Medical

## 2014-10-06 ENCOUNTER — Ambulatory Visit
Admission: RE | Admit: 2014-10-06 | Discharge: 2014-10-06 | Disposition: A | Discharge status: Home / Self Care | Payer: No Typology Code available for payment source | Source: Ambulatory Visit | Attending: Medical | Admitting: Medical

## 2014-10-06 DIAGNOSIS — F17200 Nicotine dependence, unspecified, uncomplicated: Secondary | ICD-10-CM

## 2014-10-06 DIAGNOSIS — D72829 Elevated white blood cell count, unspecified: Secondary | ICD-10-CM

## 2014-10-06 DIAGNOSIS — R634 Abnormal weight loss: Secondary | ICD-10-CM

## 2014-10-06 MED ORDER — AZITHROMYCIN 250 MG PO TABS: ORAL_TABLET | ORAL | Status: DC

## 2014-10-14 NOTE — Telephone Encounter (Signed)
pls check status.   He really should take advantage of the FREE colonoscopy.   Did he give ok for referral?   This is basically at least a $2,000 or more value which may be free to him!!!

## 2014-10-21 NOTE — Telephone Encounter (Signed)
[name], this patient is agreed to take advantage of the free colonoscopy test. What do I need do? Who do I need contact? Please, advise me on what to do next?

## 2014-10-22 NOTE — Telephone Encounter (Signed)
[facility] [phone] .  I will give you a copy of the info also.

## 2014-10-28 NOTE — Telephone Encounter (Signed)
Patient doesn't qualify for the free colonoscopy.

## 2014-11-06 ENCOUNTER — Telehealth: Payer: Self-pay

## 2014-11-06 ENCOUNTER — Ambulatory Visit: Payer: Self-pay | Admitting: Medical

## 2014-11-06 NOTE — Telephone Encounter (Signed)
[name] says they were unaware of todays appointment and did not know he was to f/up on anything. They are sorry.

## 2014-11-06 NOTE — Telephone Encounter (Signed)
This patient no showed for their appointment today.Which of the following is necessary for this patient.   A) No follow-up necessary   B) Follow-up urgent. Locate Patient Immediately.   C) Follow-up necessary. Contact patient and Schedule visit in ____ Days.   D) Follow-up Advised. Contact patient and Schedule visit in ____ Days. 

## 2014-11-06 NOTE — Telephone Encounter (Signed)
C - call and find out why they no showed.  Call and speak to Duane Boston, Mr. Diiorio caregiver.  We were concerned as its not like them to no show.  If needed, make f/u and advise that we have recently had to revise our policy, and if they no show again they will unfortunately be subject to no show fee.

## 2014-11-11 ENCOUNTER — Other Ambulatory Visit: Payer: Self-pay | Admitting: Family Medicine

## 2014-11-11 ENCOUNTER — Ambulatory Visit
Admission: RE | Admit: 2014-11-11 | Discharge: 2014-11-11 | Disposition: A | Discharge status: Home / Self Care | Payer: No Typology Code available for payment source | Source: Ambulatory Visit | Attending: Medical | Admitting: Medical

## 2014-11-11 DIAGNOSIS — Z720 Tobacco use: Secondary | ICD-10-CM

## 2014-12-01 ENCOUNTER — Other Ambulatory Visit: Payer: Self-pay | Admitting: Medical

## 2014-12-01 NOTE — Telephone Encounter (Signed)
Is this okay to refill? 

## 2015-01-10 ENCOUNTER — Other Ambulatory Visit: Payer: Self-pay | Admitting: Medical

## 2015-01-30 ENCOUNTER — Other Ambulatory Visit: Payer: Self-pay | Admitting: Medical

## 2015-02-03 ENCOUNTER — Ambulatory Visit (INDEPENDENT_AMBULATORY_CARE_PROVIDER_SITE_OTHER): Payer: Self-pay | Admitting: Medical

## 2015-02-03 ENCOUNTER — Encounter: Payer: Self-pay | Admitting: Medical

## 2015-02-03 ENCOUNTER — Other Ambulatory Visit: Payer: Self-pay | Admitting: Medical

## 2015-02-03 VITALS — BP 122/80 | HR 86 | Temp 97.7°F | Wt 193.0 lb

## 2015-02-03 DIAGNOSIS — I10 Essential (primary) hypertension: Secondary | ICD-10-CM

## 2015-02-03 DIAGNOSIS — Z9119 Patient's noncompliance with other medical treatment and regimen: Secondary | ICD-10-CM

## 2015-02-03 DIAGNOSIS — F0630 Mood disorder due to known physiological condition, unspecified: Secondary | ICD-10-CM

## 2015-02-03 DIAGNOSIS — Z91199 Patient's noncompliance with other medical treatment and regimen due to unspecified reason: Secondary | ICD-10-CM

## 2015-02-03 DIAGNOSIS — F17200 Nicotine dependence, unspecified, uncomplicated: Secondary | ICD-10-CM | POA: Insufficient documentation

## 2015-02-03 DIAGNOSIS — E786 Lipoprotein deficiency: Secondary | ICD-10-CM

## 2015-02-03 DIAGNOSIS — E119 Type 2 diabetes mellitus without complications: Secondary | ICD-10-CM

## 2015-02-03 DIAGNOSIS — L089 Local infection of the skin and subcutaneous tissue, unspecified: Secondary | ICD-10-CM

## 2015-02-03 DIAGNOSIS — Z720 Tobacco use: Secondary | ICD-10-CM

## 2015-02-03 DIAGNOSIS — D72829 Elevated white blood cell count, unspecified: Secondary | ICD-10-CM

## 2015-02-03 DIAGNOSIS — B182 Chronic viral hepatitis C: Secondary | ICD-10-CM

## 2015-02-03 LAB — CBC
HCT: 48.3 % (ref 39.0–52.0)
Hemoglobin: 16.6 g/dL (ref 13.0–17.0)
MCH: 32 pg (ref 26.0–34.0)
MCHC: 34.4 g/dL (ref 30.0–36.0)
MCV: 93.1 fL (ref 78.0–100.0)
MPV: 9.6 fL (ref 8.6–12.4)
PLATELETS: 239 10*3/uL (ref 150–400)
RBC: 5.19 MIL/uL (ref 4.22–5.81)
RDW: 14.8 % (ref 11.5–15.5)
WBC: 9.3 10*3/uL (ref 4.0–10.5)

## 2015-02-03 LAB — COMPREHENSIVE METABOLIC PANEL
ALBUMIN: 4.5 g/dL (ref 3.5–5.2)
ALK PHOS: 74 U/L (ref 39–117)
ALT: 138 U/L — ABNORMAL HIGH (ref 0–53)
AST: 63 U/L — AB (ref 0–37)
BUN: 24 mg/dL — ABNORMAL HIGH (ref 6–23)
CO2: 26 mEq/L (ref 19–32)
Calcium: 9.5 mg/dL (ref 8.4–10.5)
Chloride: 102 mEq/L (ref 96–112)
Creat: 0.71 mg/dL (ref 0.50–1.35)
Glucose, Bld: 112 mg/dL — ABNORMAL HIGH (ref 70–99)
POTASSIUM: 5 meq/L (ref 3.5–5.3)
Sodium: 136 mEq/L (ref 135–145)
Total Bilirubin: 0.4 mg/dL (ref 0.2–1.2)
Total Protein: 7 g/dL (ref 6.0–8.3)

## 2015-02-03 MED ORDER — CARBAMAZEPINE 200 MG PO TABS: 200.0000 mg | ORAL_TABLET | Freq: Three times a day (TID) | ORAL | Status: DC

## 2015-02-03 MED ORDER — LISINOPRIL-HYDROCHLOROTHIAZIDE 20-12.5 MG PO TABS: 1.0000 | ORAL_TABLET | Freq: Every day | ORAL | Status: DC

## 2015-02-03 MED ORDER — METFORMIN HCL 500 MG PO TABS: 500.0000 mg | ORAL_TABLET | Freq: Two times a day (BID) | ORAL | Status: DC

## 2015-02-03 MED ORDER — ONE-DAILY MULTI VITAMINS PO TABS
1.0000 | ORAL_TABLET | Freq: Every day | ORAL | Status: DC
Start: 2015-02-03 — End: 2016-05-18

## 2015-02-03 MED ORDER — CITALOPRAM HYDROBROMIDE 40 MG PO TABS: 40.0000 mg | ORAL_TABLET | Freq: Every day | ORAL | Status: DC

## 2015-02-03 MED ORDER — VERAPAMIL HCL ER 180 MG PO TBCR: 180.0000 mg | EXTENDED_RELEASE_TABLET | Freq: Every day | ORAL | Status: DC

## 2015-02-03 MED ORDER — CEPHALEXIN 500 MG PO CAPS: 500.0000 mg | ORAL_CAPSULE | Freq: Three times a day (TID) | ORAL | Status: DC

## 2015-02-03 MED ORDER — BUPROPION HCL 100 MG PO TABS: 100.0000 mg | ORAL_TABLET | Freq: Two times a day (BID) | ORAL | Status: DC

## 2015-02-03 MED ORDER — OMEPRAZOLE 20 MG PO CPDR: 20.0000 mg | DELAYED_RELEASE_CAPSULE | Freq: Every day | ORAL | Status: DC

## 2015-02-03 MED ORDER — ATORVASTATIN CALCIUM 20 MG PO TABS: 20.0000 mg | ORAL_TABLET | Freq: Every day | ORAL | Status: DC

## 2015-02-03 NOTE — Addendum Note (Signed)
Addended by: Carlena Hurl on: 02/03/2015 10:31 AM   Modules accepted: Orders

## 2015-02-03 NOTE — Progress Notes (Signed)
  Subjective:   [name] is an 54 y.o. male who presents for follow up of Type 2 diabetes mellitus, HTN, lipids, mood disorder and foot issue.  accompanied by his caregiver as usual.  Patient is not checking home blood sugars.   Current symptoms include: none. Patient denies NONE.  Patient is checking their feet daily. Foot concerns (callous, ulcer, wound, thickened nails, toenail fungus, skin fungus, hammer toe): WAS RECOMMENDED TO GO TO FOOT DOCTOR BUT HASN'T WENT YET. Last dilated eye exam NO  Current treatments: metformin BID Medication compliance: good  Current diet: on average, 3 TO4 meals per day Current exercise: walking Known diabetic complications: none  Hyperlipidemia - he notes that he never started Lipitor despite last visit endorsing he had started the medication  HTN - compliant with medication without c/o  Mood disorder - compliant with medication.   Mood down a bit lately, but no specific life event or stressor.    Smoking - smokes a lot when in a hotel on vacation and has been on vacation a lot recently  He note sore red painful pimple of left great toe x 3-4 days.  No pus , no fever, has had this prior.   Still using Lamisil topical for toenails.  The following portions of the patient's history were reviewed and updated as appropriate: allergies, current medications, past family history, past medical history, past social history, past surgical history and problem list.  ROS as in subjective above    Objective:   Gen: wd, wn, nad Skin: left dorsal proximal great toe with ares of erythema, central raised papular lesion, tight skin, and slight warmth, suggestive of early cellulitis, no pus, no fluctuance Lungs: decreased breath sounds in general, no wheezing or rales or rhonchi Heart: RRR, normal s1, s2, no murmurs Pulses normal Ext: no edema Foot exam - see separate exam   Assessment:   Encounter Diagnoses  Name Primary?  . Diabetes type 2,  controlled Yes  . Low HDL (under 40)   . Essential hypertension   . Mood disorder in conditions classified elsewhere   . Infection of skin of toes   . Chronic hepatitis C without hepatic coma   . Noncompliance   . Tobacco use disorder   . Leukocytosis      Plan:   DM type 2 - labs today, compliant with metformin but not diet or exercise, advised dialy foot checks, gave glucometer kit today to start checking glucose periodically, weekly    Low HDL - begin Lipitor, diet recommendations  HTN - c/t same medication  Mood disorder - c/t current medications  Left great toe skin infection - begin Keflex, if not much improved in 3-4 days, call or recheck  Chronic hep C - refuses to go to hepatitis clinic  Noncompliant - on hepatitis clinic referral, diet, exercise, and vaccines.  [facility] due to no insurance and need for vaccines, hepatitis clinic referral and other care.   Tobacco use - no plans to stop  Leukocytosis - recheck labs from last visit.

## 2015-02-04 LAB — POCT GLYCOSYLATED HEMOGLOBIN (HGB A1C): Hemoglobin A1C: 5.9

## 2015-02-23 ENCOUNTER — Telehealth: Payer: Self-pay | Admitting: Medical

## 2015-02-23 MED ORDER — ATORVASTATIN CALCIUM 20 MG PO TABS: 20.0000 mg | ORAL_TABLET | Freq: Every day | ORAL | Status: DC

## 2015-02-23 NOTE — Telephone Encounter (Signed)
done

## 2015-02-23 NOTE — Telephone Encounter (Signed)
ALERT PT HAS A NEW PHARMACY. Pt needs refills of lipitor sent to NEW PHARMACY. NEW PHARMACY IS [facility] in [place].

## 2015-04-17 ENCOUNTER — Other Ambulatory Visit: Payer: Self-pay | Admitting: Medical

## 2015-06-03 ENCOUNTER — Other Ambulatory Visit: Payer: Self-pay | Admitting: Medical

## 2015-06-04 NOTE — Telephone Encounter (Signed)
Is this ok to refill?  

## 2015-06-09 ENCOUNTER — Telehealth: Payer: Self-pay | Admitting: Medical

## 2015-06-09 NOTE — Telephone Encounter (Signed)
Requesting that script for Bupropion 100MG  be sent to [facility] in [facility] @ [address] Med is less expensive at this pharmacy. It is too expensive at their old pharmacy [facility]).

## 2015-06-10 ENCOUNTER — Other Ambulatory Visit: Payer: Self-pay | Admitting: Medical

## 2015-06-10 MED ORDER — BUPROPION HCL 100 MG PO TABS: 100.0000 mg | ORAL_TABLET | Freq: Two times a day (BID) | ORAL | Status: DC

## 2015-06-10 NOTE — Telephone Encounter (Signed)
done

## 2015-06-19 ENCOUNTER — Other Ambulatory Visit: Payer: Self-pay | Admitting: Medical

## 2015-08-20 ENCOUNTER — Other Ambulatory Visit: Payer: Self-pay | Admitting: Medical

## 2015-08-20 NOTE — Telephone Encounter (Signed)
Is this ok to refill?  

## 2015-10-18 ENCOUNTER — Other Ambulatory Visit: Payer: Self-pay | Admitting: Medical

## 2015-10-19 NOTE — Telephone Encounter (Signed)
Is this ok to refill?  

## 2015-11-18 ENCOUNTER — Other Ambulatory Visit: Payer: Self-pay | Admitting: Medical

## 2015-11-18 NOTE — Telephone Encounter (Signed)
Is this ok to refill?  

## 2015-11-18 NOTE — Telephone Encounter (Signed)
Approve 55mo and get him in for f/u.

## 2015-11-18 NOTE — Telephone Encounter (Signed)
Pt stated he would call back and make an appt

## 2015-12-23 ENCOUNTER — Encounter: Payer: Self-pay | Admitting: Medical

## 2015-12-31 ENCOUNTER — Ambulatory Visit (INDEPENDENT_AMBULATORY_CARE_PROVIDER_SITE_OTHER): Payer: Self-pay | Admitting: Medical

## 2015-12-31 ENCOUNTER — Encounter: Payer: Self-pay | Admitting: Medical

## 2015-12-31 VITALS — BP 142/80 | HR 100 | Wt 198.0 lb

## 2015-12-31 DIAGNOSIS — F32A Depression, unspecified: Secondary | ICD-10-CM

## 2015-12-31 DIAGNOSIS — R258 Other abnormal involuntary movements: Secondary | ICD-10-CM

## 2015-12-31 DIAGNOSIS — E669 Obesity, unspecified: Secondary | ICD-10-CM

## 2015-12-31 DIAGNOSIS — K219 Gastro-esophageal reflux disease without esophagitis: Secondary | ICD-10-CM

## 2015-12-31 DIAGNOSIS — F0630 Mood disorder due to known physiological condition, unspecified: Secondary | ICD-10-CM

## 2015-12-31 DIAGNOSIS — B182 Chronic viral hepatitis C: Secondary | ICD-10-CM

## 2015-12-31 DIAGNOSIS — E118 Type 2 diabetes mellitus with unspecified complications: Secondary | ICD-10-CM | POA: Insufficient documentation

## 2015-12-31 DIAGNOSIS — E786 Lipoprotein deficiency: Secondary | ICD-10-CM

## 2015-12-31 DIAGNOSIS — R259 Unspecified abnormal involuntary movements: Secondary | ICD-10-CM

## 2015-12-31 DIAGNOSIS — N529 Male erectile dysfunction, unspecified: Secondary | ICD-10-CM

## 2015-12-31 DIAGNOSIS — G252 Other specified forms of tremor: Secondary | ICD-10-CM

## 2015-12-31 DIAGNOSIS — G4700 Insomnia, unspecified: Secondary | ICD-10-CM

## 2015-12-31 DIAGNOSIS — Z125 Encounter for screening for malignant neoplasm of prostate: Secondary | ICD-10-CM

## 2015-12-31 DIAGNOSIS — F419 Anxiety disorder, unspecified: Secondary | ICD-10-CM

## 2015-12-31 DIAGNOSIS — F429 Obsessive-compulsive disorder, unspecified: Secondary | ICD-10-CM

## 2015-12-31 DIAGNOSIS — I10 Essential (primary) hypertension: Secondary | ICD-10-CM

## 2015-12-31 DIAGNOSIS — F329 Major depressive disorder, single episode, unspecified: Secondary | ICD-10-CM

## 2015-12-31 DIAGNOSIS — F17200 Nicotine dependence, unspecified, uncomplicated: Secondary | ICD-10-CM

## 2015-12-31 DIAGNOSIS — E785 Hyperlipidemia, unspecified: Secondary | ICD-10-CM

## 2015-12-31 LAB — CBC
HEMATOCRIT: 48 % (ref 38.5–50.0)
Hemoglobin: 16.2 g/dL (ref 13.2–17.1)
MCH: 31.9 pg (ref 27.0–33.0)
MCHC: 33.8 g/dL (ref 32.0–36.0)
MCV: 94.5 fL (ref 80.0–100.0)
MPV: 9.3 fL (ref 7.5–12.5)
Platelets: 243 10*3/uL (ref 140–400)
RBC: 5.08 MIL/uL (ref 4.20–5.80)
RDW: 14.2 % (ref 11.0–15.0)
WBC: 8.8 10*3/uL (ref 4.0–10.5)

## 2015-12-31 LAB — HEMOGLOBIN A1C
HEMOGLOBIN A1C: 6.7 % — AB (ref <5.7)
Mean Plasma Glucose: 146 mg/dL

## 2015-12-31 MED ORDER — OMEPRAZOLE 20 MG PO CPDR: 20.0000 mg | DELAYED_RELEASE_CAPSULE | Freq: Every day | ORAL | Status: DC

## 2015-12-31 MED ORDER — BUPROPION HCL 100 MG PO TABS: 100.0000 mg | ORAL_TABLET | Freq: Two times a day (BID) | ORAL | Status: DC

## 2015-12-31 MED ORDER — CITALOPRAM HYDROBROMIDE 40 MG PO TABS: 40.0000 mg | ORAL_TABLET | Freq: Every day | ORAL | Status: DC

## 2015-12-31 MED ORDER — VERAPAMIL HCL ER 180 MG PO TBCR: 180.0000 mg | EXTENDED_RELEASE_TABLET | Freq: Every day | ORAL | Status: DC

## 2015-12-31 MED ORDER — CARBAMAZEPINE 200 MG PO TABS: ORAL_TABLET | ORAL | Status: DC

## 2015-12-31 MED ORDER — LISINOPRIL-HYDROCHLOROTHIAZIDE 20-12.5 MG PO TABS
1.0000 | ORAL_TABLET | Freq: Every day | ORAL | Status: DC
Start: 2015-12-31 — End: 2017-05-09

## 2015-12-31 NOTE — Progress Notes (Addendum)
Subjective:   [name] is an 55 y.o. male who presents for follow up of Type 2 diabetes mellitus, HTN, lipids, chronic hep c, noncompliance, mood disorder.  accompanied by his caregiver [name] as usual. Chief Complaint  Patient presents with  . medcheck    no problems or concerns     Patient is not checking home blood sugars.   Current symptoms include: none. Patient denies NONE.  Patient is checking their feet daily.  Has a briar that got stuck in left foot.   Thinks its working its [place] out.   Last dilated eye exam NO  Current treatments: metformin BID Medication compliance: good  Current diet: on average, 3 TO4 meals per day Current exercise: walking, riding his bike  Hyperlipidemia - compliant with Lipitor  HTN - compliant with medication without c/o  Mood disorder - compliant with medication.   No recent issues  Smoking - smokes a lot when in a hotel on vacation and has been on vacation a lot recently  He has been drinking a little more lately.   He hangs out at the store, and people give him money to buy alcohol.  He reports recent problems getting erections, says he has massive ED.  He notes he drinks when he is bored, but has no hobbies.   The following portions of the patient's history were reviewed and updated as appropriate: allergies, current medications, past family history, past medical history, past social history, past surgical history and problem list.  ROS as in subjective above    Objective:  BP 142/80 mmHg  Pulse 100  Wt 198 lb (89.812 kg)  BP Readings from Last 3 Encounters:  12/31/15 142/80  02/03/15 122/80  09/30/14 98/60   Wt Readings from Last 3 Encounters:  12/31/15 198 lb (89.812 kg)  02/03/15 193 lb (87.544 kg)  09/30/14 181 lb (82.101 kg)    Gen: wd, wn, nad hent unremarkable Lungs: decreased breath sounds in general, no wheezing or rales or rhonchi Heart: RRR, normal s1, s2, no murmurs Pulses normal Ext: no  edema Abdomen: +bs, soft, nontender, no mass, no organomegaly Foot exam - see separate exam  Diabetic Foot Exam - Simple   Simple Foot Form  Diabetic Foot exam was performed with the following findings:  Yes 12/31/2015  8:50 AM  Visual Inspection  See comments:  Yes  Sensation Testing  Intact to touch and monofilament testing bilaterally:  Yes  Pulse Check  Posterior Tibialis and Dorsalis pulse intact bilaterally:  Yes  Comments  Thickened and long toenails, small possible briar/foreign body left volar foot, just under skin       Assessment:   Encounter Diagnoses  Name Primary?  . Essential hypertension Yes  . Hyperlipidemia   . Gastroesophageal reflux disease without esophagitis   . Insomnia   . Low HDL (under 40)   . Tobacco use disorder   . Mood disorder in conditions classified elsewhere   . OCD (obsessive compulsive disorder)   . Anxiety   . Chronic hepatitis C without hepatic coma ([place])   . Depression   . Diabetes mellitus with complication ([place])   . Obesity   . Screening for prostate cancer   . Erectile dysfunction, unspecified erectile dysfunction type   . Resting tremor      Plan:   DM type 2 - labs today, compliant with metformin but not diet or exercise, advised daily foot checks, advised yearly eye exam, dental visit    Low HDL -  c/t Lipitor, labs today  HTN - c/t same medication  Mood disorder - c/t current medications  Chronic hep C - last year called free liver clinic in [place] but they would never call him back  Noncompliant - on hepatitis clinic referral, diet, exercise, and vaccines.  [facility] due to no insurance and need for vaccines, hepatitis clinic referral and other care.   Tobacco use - no plans to stop  He has limitations to care including lack of finances, no insurance, mental health issues, noncompliance  Needs updated vaccines, colonoscopy, hepatitis and podiatry clinic referrals, but for reasons  above declines  Routine labs today.  Diontre was seen today for medcheck.  Diagnoses and all orders for this visit:  Essential hypertension -     Basic metabolic panel -     CBC -     Lipid panel -     Hemoglobin A1c  Hyperlipidemia -     Lipid panel  Gastroesophageal reflux disease without esophagitis  Insomnia  Low HDL (under 40)  Tobacco use disorder  Mood disorder in conditions classified elsewhere  OCD (obsessive compulsive disorder)  Anxiety  Chronic hepatitis C without hepatic coma (H[facility]) -     Basic metabolic panel -     CBC -     Hepatic function panel  Depression  Diabetes mellitus with complication (HCC) -     Hemoglobin A1c -     HM DIABETES EYE EXAM -     HM DIABETES FOOT EXAM -     Microalbumin / creatinine urine ratio  Obesity  Screening for prostate cancer -     PSA  Erectile dysfunction, unspecified erectile dysfunction type  Resting tremor  Other orders -     carbamazepine (TEGRETOL) 200 MG tablet; TAKE 1 TABLET (200 MG TOTAL) BY MOUTH 3 (THREE) TIMES DAILY. -     lisinopril-hydrochlorothiazide (PRINZIDE,ZESTORETIC) 20-12.5 MG tablet; Take 1 tablet by mouth daily. -     buPROPion (WELLBUTRIN) 100 MG tablet; Take 1 tablet (100 mg total) by mouth 2 (two) times daily. -     verapamil (CALAN-SR) 180 MG CR tablet; Take 1 tablet (180 mg total) by mouth daily. -     omeprazole (PRILOSEC) 20 MG capsule; Take 1 capsule (20 mg total) by mouth daily. -     citalopram (CELEXA) 40 MG tablet; Take 1 tablet (40 mg total) by mouth daily.

## 2016-01-01 LAB — BASIC METABOLIC PANEL
BUN: 22 mg/dL (ref 7–25)
CO2: 20 mmol/L (ref 20–31)
CREATININE: 0.89 mg/dL (ref 0.70–1.33)
Calcium: 9.8 mg/dL (ref 8.6–10.3)
Chloride: 103 mmol/L (ref 98–110)
GLUCOSE: 170 mg/dL — AB (ref 65–99)
POTASSIUM: 5.3 mmol/L (ref 3.5–5.3)
Sodium: 138 mmol/L (ref 135–146)

## 2016-01-01 LAB — HEPATIC FUNCTION PANEL
ALT: 159 U/L — AB (ref 9–46)
AST: 87 U/L — ABNORMAL HIGH (ref 10–35)
Albumin: 4.4 g/dL (ref 3.6–5.1)
Alkaline Phosphatase: 117 U/L — ABNORMAL HIGH (ref 40–115)
Bilirubin, Direct: 0.1 mg/dL (ref <=0.2)
Indirect Bilirubin: 0.2 mg/dL (ref 0.2–1.2)
TOTAL PROTEIN: 7.3 g/dL (ref 6.1–8.1)
Total Bilirubin: 0.3 mg/dL (ref 0.2–1.2)

## 2016-01-01 LAB — LIPID PANEL
Cholesterol: 188 mg/dL (ref 125–200)
HDL: 43 mg/dL (ref >=40)
LDL CALC: 99 mg/dL (ref <130)
TRIGLYCERIDES: 229 mg/dL — AB (ref <150)
Total CHOL/HDL Ratio: 4.4 Ratio (ref <=5.0)
VLDL: 46 mg/dL — ABNORMAL HIGH (ref <30)

## 2016-01-01 LAB — MICROALBUMIN / CREATININE URINE RATIO
CREATININE, URINE: 142 mg/dL (ref 20–370)
MICROALB UR: 0.8 mg/dL
Microalb Creat Ratio: 6 mcg/mg creat (ref <30)

## 2016-01-01 LAB — PSA: PSA: 1.48 ng/mL (ref <=4.00)

## 2016-01-04 ENCOUNTER — Other Ambulatory Visit: Payer: Self-pay | Admitting: Medical

## 2016-01-04 MED ORDER — METFORMIN HCL 500 MG PO TABS: 500.0000 mg | ORAL_TABLET | Freq: Two times a day (BID) | ORAL | Status: DC

## 2016-01-04 MED ORDER — ATORVASTATIN CALCIUM 20 MG PO TABS: 20.0000 mg | ORAL_TABLET | Freq: Every day | ORAL | Status: DC

## 2016-01-05 ENCOUNTER — Telehealth: Payer: Self-pay

## 2016-01-05 ENCOUNTER — Telehealth: Payer: Self-pay | Admitting: Medical

## 2016-01-05 MED ORDER — CITALOPRAM HYDROBROMIDE 40 MG PO TABS: 40.0000 mg | ORAL_TABLET | Freq: Every day | ORAL | Status: DC

## 2016-01-05 MED ORDER — METFORMIN HCL 500 MG PO TABS: 500.0000 mg | ORAL_TABLET | Freq: Two times a day (BID) | ORAL | Status: DC

## 2016-01-05 MED ORDER — BUPROPION HCL 100 MG PO TABS: 100.0000 mg | ORAL_TABLET | Freq: Two times a day (BID) | ORAL | Status: DC

## 2016-01-05 MED ORDER — ATORVASTATIN CALCIUM 20 MG PO TABS: 20.0000 mg | ORAL_TABLET | Freq: Every day | ORAL | Status: DC

## 2016-01-05 MED ORDER — OMEPRAZOLE 20 MG PO CPDR: 20.0000 mg | DELAYED_RELEASE_CAPSULE | Freq: Every day | ORAL | Status: DC

## 2016-01-05 NOTE — Telephone Encounter (Signed)
Has appt at [facility] in [place] on 01/12/16 at 10:30 am. pts roommate [name] on his hippa is aware. It is based on income and how many people live in the home and pt must have a min of twenty dollars to be seen

## 2016-01-05 NOTE — Telephone Encounter (Signed)
No leave it as ordered as he has been tolerating this fine

## 2016-01-05 NOTE — Telephone Encounter (Signed)
Pt request medication be sent to [facility] as originally filled.Meds resent to [facility].  [name]

## 2016-01-05 NOTE — Telephone Encounter (Signed)
[facility] called to report a drug interaction between Omeprazole and Citalopram 40 mg. Pharmacist state that omeprazole can increase the effects of Citalopram therefore it is suggested to only take 20 mg's of Citalopram while on Omeprazole. Does [name] want to change any of pt's meds?

## 2016-01-06 NOTE — Telephone Encounter (Signed)
Called [facility] to advise of Shane's instructions

## 2016-01-14 ENCOUNTER — Telehealth: Payer: Self-pay | Admitting: Medical

## 2016-01-14 NOTE — Telephone Encounter (Signed)
Pt came in and stated that he wanted Audelia Acton to know that he went to [facility] Tuesday and Wednesday of this week to have blood drawn. He also wanted to thank [facility] for getting him into that program. Pt can be reached at [phone].

## 2016-01-14 NOTE — Telephone Encounter (Signed)
I'm glad he got in.   I assume this is referring to hepatitis clinic.

## 2016-01-15 NOTE — Telephone Encounter (Signed)
FYI  Yes it is a [facility] that works with low income people.

## 2016-01-26 ENCOUNTER — Encounter: Payer: Self-pay | Admitting: Family Medicine

## 2016-01-26 ENCOUNTER — Ambulatory Visit (INDEPENDENT_AMBULATORY_CARE_PROVIDER_SITE_OTHER): Payer: Self-pay | Admitting: Family Medicine

## 2016-01-26 VITALS — BP 130/90 | HR 65 | Temp 98.5°F | Wt 202.4 lb

## 2016-01-26 DIAGNOSIS — N3090 Cystitis, unspecified without hematuria: Secondary | ICD-10-CM

## 2016-01-26 DIAGNOSIS — R0781 Pleurodynia: Secondary | ICD-10-CM

## 2016-01-26 DIAGNOSIS — R310 Gross hematuria: Secondary | ICD-10-CM

## 2016-01-26 LAB — CBC WITH DIFFERENTIAL/PLATELET
BASOS PCT: 1 %
Basophils Absolute: 57 cells/uL (ref 0–200)
EOS ABS: 57 {cells}/uL (ref 15–500)
Eosinophils Relative: 1 %
HEMATOCRIT: 44.1 % (ref 38.5–50.0)
HEMOGLOBIN: 14.9 g/dL (ref 13.2–17.1)
LYMPHS ABS: 1425 {cells}/uL (ref 850–3900)
Lymphocytes Relative: 25 %
MCH: 31.6 pg (ref 27.0–33.0)
MCHC: 33.8 g/dL (ref 32.0–36.0)
MCV: 93.6 fL (ref 80.0–100.0)
MONO ABS: 855 {cells}/uL (ref 200–950)
MPV: 9.2 fL (ref 7.5–12.5)
Monocytes Relative: 15 %
NEUTROS ABS: 3306 {cells}/uL (ref 1500–7800)
Neutrophils Relative %: 58 %
PLATELETS: 244 10*3/uL (ref 140–400)
RBC: 4.71 MIL/uL (ref 4.20–5.80)
RDW: 14.5 % (ref 11.0–15.0)
WBC: 5.7 10*3/uL (ref 4.0–10.5)

## 2016-01-26 LAB — POCT URINALYSIS DIPSTICK
Bilirubin, UA: NEGATIVE
Glucose, UA: NEGATIVE
KETONES UA: POSITIVE
PH UA: 5.5
Spec Grav, UA: >=1.03
Urobilinogen, UA: 2

## 2016-01-26 MED ORDER — CIPROFLOXACIN HCL 500 MG PO TABS: 500.0000 mg | ORAL_TABLET | Freq: Two times a day (BID) | ORAL | Status: DC

## 2016-01-26 NOTE — Patient Instructions (Signed)
Complete the antibiotic. Stay well hydrated. We are referring you to the urologist. You will receive a phone call. Call/return if you develop fever, chills, nausea, vomiting or pain.   Urinary Tract Infection Urinary tract infections (UTIs) can develop anywhere along your urinary tract. Your urinary tract is your body's drainage system for removing wastes and extra water. Your urinary tract includes two kidneys, two ureters, a bladder, and a urethra. Your kidneys are a pair of bean-shaped organs. Each kidney is about the size of your fist. They are located below your ribs, one on each side of your spine. CAUSES Infections are caused by microbes, which are microscopic organisms, including fungi, viruses, and bacteria. These organisms are so small that they can only be seen through a microscope. Bacteria are the microbes that most commonly cause UTIs. SYMPTOMS  Symptoms of UTIs may vary by age and gender of the patient and by the location of the infection. Symptoms in young women typically include a frequent and intense urge to urinate and a painful, burning feeling in the bladder or urethra during urination. Older women and men are more likely to be tired, shaky, and weak and have muscle aches and abdominal pain. A fever may mean the infection is in your kidneys. Other symptoms of a kidney infection include pain in your back or sides below the ribs, nausea, and vomiting. DIAGNOSIS To diagnose a UTI, your caregiver will ask you about your symptoms. Your caregiver will also ask you to provide a urine sample. The urine sample will be tested for bacteria and white blood cells. White blood cells are made by your body to help fight infection. TREATMENT  Typically, UTIs can be treated with medication. Because most UTIs are caused by a bacterial infection, they usually can be treated with the use of antibiotics. The choice of antibiotic and length of treatment depend on your symptoms and the type of bacteria  causing your infection. HOME CARE INSTRUCTIONS  If you were prescribed antibiotics, take them exactly as your caregiver instructs you. Finish the medication even if you feel better after you have only taken some of the medication.  Drink enough water and fluids to keep your urine clear or pale yellow.  Avoid caffeine, tea, and carbonated beverages. They tend to irritate your bladder.  Empty your bladder often. Avoid holding urine for long periods of time.  Empty your bladder before and after sexual intercourse.  After a bowel movement, women should cleanse from front to back. Use each tissue only once. SEEK MEDICAL CARE IF:   You have back pain.  You develop a fever.  Your symptoms do not begin to resolve within 3 days. SEEK IMMEDIATE MEDICAL CARE IF:   You have severe back pain or lower abdominal pain.  You develop chills.  You have nausea or vomiting.  You have continued burning or discomfort with urination. MAKE SURE YOU:   Understand these instructions.  Will watch your condition.  Will get help right away if you are not doing well or get worse.   This information is not intended to replace advice given to you by your health care provider. Make sure you discuss any questions you have with your health care provider.   Document Released: 04/27/2005 Document Revised: 04/08/2015 Document Reviewed: 08/26/2011 Elsevier Interactive Patient Education [employer].
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
## 2016-01-27 ENCOUNTER — Other Ambulatory Visit: Payer: Self-pay | Admitting: Medical

## 2016-01-27 DIAGNOSIS — R310 Gross hematuria: Secondary | ICD-10-CM

## 2016-01-27 LAB — COMPREHENSIVE METABOLIC PANEL
ALBUMIN: 4 g/dL (ref 3.6–5.1)
ALK PHOS: 139 U/L — AB (ref 40–115)
ALT: 185 U/L — AB (ref 9–46)
AST: 113 U/L — ABNORMAL HIGH (ref 10–35)
BUN: 17 mg/dL (ref 7–25)
CALCIUM: 8.9 mg/dL (ref 8.6–10.3)
CO2: 26 mmol/L (ref 20–31)
Chloride: 102 mmol/L (ref 98–110)
Creat: 0.82 mg/dL (ref 0.70–1.33)
Glucose, Bld: 149 mg/dL — ABNORMAL HIGH (ref 65–99)
POTASSIUM: 4.9 mmol/L (ref 3.5–5.3)
Sodium: 139 mmol/L (ref 135–146)
TOTAL PROTEIN: 6.7 g/dL (ref 6.1–8.1)
Total Bilirubin: 0.3 mg/dL (ref 0.2–1.2)

## 2016-01-29 LAB — URINE CULTURE

## 2016-04-19 ENCOUNTER — Telehealth: Payer: Self-pay

## 2016-04-19 NOTE — Telephone Encounter (Signed)
Pt called the office to let you know that he has been seen by Dr. Edwina Barth for his Hep C treatment. He states he was told he needs to stop taking the carbamazepine because it interacts with his Hep C medications.  Was told he needed a medication like depakote. # [phone] to Dr. Edwina Barth. Please advise on any necessary med changes.    Thank you, [name]

## 2016-04-20 NOTE — Telephone Encounter (Signed)
Please call the doctor's office back.  He is on the medication for mood/bipolar/depression.   Do they have other recommendations on what medications would be safe/which to stay away from for depression/bipolar?    We have been using this medication due to cheaper costs and tolerability compared to other options.

## 2016-04-20 NOTE — Telephone Encounter (Signed)
Number to Dr. Edwina Barth office is not valid.   LM for pt to CB.

## 2016-04-21 NOTE — Telephone Encounter (Signed)
LM with nurse June Frazier with Dr. Edwina Barth. Asked for Dr. Edwina Barth to give [name] a call so they can speak about this.

## 2016-05-03 NOTE — Telephone Encounter (Signed)
I spoke to Dr. Wynetta Emery with liver specialist in [place].  We need to wean him off the Carbamezapine.   This will interact with liver medications they want to use.    He currently takes Wellbutrin, Citalopram and Carbamezapine for mood/depression.     So have him wean off by taking Carbamazepine, 1 tablet BID for a week, then 1 tablet daily for a week then stop this completely.  Lets have him f/u in 2wk to discus next steps

## 2016-05-03 NOTE — Telephone Encounter (Signed)
Left message for pt to call back  °

## 2016-05-04 NOTE — Telephone Encounter (Signed)
Pt notified of recommendations and verbalized plan on how to dose medication for next two weeks. Appt scheduled for f/u.

## 2016-05-09 ENCOUNTER — Other Ambulatory Visit: Payer: Self-pay | Admitting: Medical

## 2016-05-09 NOTE — Telephone Encounter (Signed)
Is this okay to refill? 

## 2016-05-18 ENCOUNTER — Encounter: Payer: Self-pay | Admitting: Medical

## 2016-05-18 ENCOUNTER — Telehealth: Payer: Self-pay | Admitting: Medical

## 2016-05-18 ENCOUNTER — Ambulatory Visit (INDEPENDENT_AMBULATORY_CARE_PROVIDER_SITE_OTHER): Payer: Self-pay | Admitting: Medical

## 2016-05-18 VITALS — BP 138/78 | HR 98 | Temp 98.4°F | Ht 68.0 in | Wt 205.0 lb

## 2016-05-18 DIAGNOSIS — F17200 Nicotine dependence, unspecified, uncomplicated: Secondary | ICD-10-CM

## 2016-05-18 DIAGNOSIS — I10 Essential (primary) hypertension: Secondary | ICD-10-CM

## 2016-05-18 DIAGNOSIS — F429 Obsessive-compulsive disorder, unspecified: Secondary | ICD-10-CM

## 2016-05-18 DIAGNOSIS — F0630 Mood disorder due to known physiological condition, unspecified: Secondary | ICD-10-CM

## 2016-05-18 DIAGNOSIS — E118 Type 2 diabetes mellitus with unspecified complications: Secondary | ICD-10-CM

## 2016-05-18 DIAGNOSIS — F1010 Alcohol abuse, uncomplicated: Secondary | ICD-10-CM

## 2016-05-18 DIAGNOSIS — E782 Mixed hyperlipidemia: Secondary | ICD-10-CM

## 2016-05-18 DIAGNOSIS — F419 Anxiety disorder, unspecified: Secondary | ICD-10-CM

## 2016-05-18 DIAGNOSIS — F3341 Major depressive disorder, recurrent, in partial remission: Secondary | ICD-10-CM

## 2016-05-18 LAB — POCT GLYCOSYLATED HEMOGLOBIN (HGB A1C): Hemoglobin A1C: 6.7

## 2016-05-18 MED ORDER — AMLODIPINE BESYLATE 10 MG PO TABS: 10.0000 mg | ORAL_TABLET | Freq: Every day | ORAL | 1 refills | Status: DC

## 2016-05-18 MED ORDER — RISPERIDONE 1 MG PO TABS: 1.0000 mg | ORAL_TABLET | Freq: Two times a day (BID) | ORAL | 1 refills | Status: DC

## 2016-05-18 NOTE — Telephone Encounter (Signed)
Spoke to patient. Gave info about FibroScan. Patient stated he will try to apply for [facility]. Provided [email or web address] web address if able to go online.

## 2016-05-18 NOTE — Patient Instructions (Signed)
Mood/depression  Now that you have weaned off Carbamezapine, begin Risperidone by taking 1/2 tablet twice daily for a week.  Then after a week increase Risperidone to a whole tablet (1mg ) twice daily ongoing  Continue Citalopram and Wellbutrin/bupropion for now  Let me know within the next 2 weeks how you are doing with mood, sleep, irritability  Blood Pressure  After 2 weeks you can make the change in your blood pressure medication from Verapamil to Norvasc for cost savings  If your verapamil is a tablet, cut in half and take 1/2 tablet daily for a week, then stop  At the same time you are coming off the verapamil, begin Amlodipine/Norvasc, 1/2 tablet daily for a week  After a week, go to 1 tablet daily of Amlodipine/Norvasc  Monitor blood pressure during this change such as using pharmacy BP cuff, or consider buying a cuff, or checking at doctor's offices.  By the time you have made both changes above, it will be a month from now  At that point, call back and if you are doing ok, we may consider at that time changing from Bupropion/Wellbutrin to Trazodone

## 2016-05-18 NOTE — Telephone Encounter (Signed)
Please call either the local hepatitis clinic or [facility] to see if they do a liver cirrhosis FibroScan for patients with liver cirrhosis.   This patient has no insurance and needs this test, preferably free or charity case or as cheap as possible.   So I want to see is this test done here locally and what is the usual cost?  Can we find a [facility] to get it free?

## 2016-05-18 NOTE — Telephone Encounter (Signed)
FibroScan (Liver Elastography)-[id number] are preformed at [facility], [place], or [facility].  Not at [facility] or [place]. The earliest appt(s) available at [facility] is 06/21/16 as of now.  Still working on cost info/ payment assistance.  [facility] card is accepted.

## 2016-05-18 NOTE — Progress Notes (Signed)
Subjective: Chief Complaint  Patient presents with  . Diabetes    follow up   Here for f/u on diabetes and mood.   Recently he established with hepatitis clinic with [facility].  They had called and wanted him off the carbamazepine due to interaction with the drugs they want to use for chronic hepatitis C infection.   He has been weaning off per our instructions.    Was taking carbamazepine, and is still taking citalopram 40mg  daily, and Wellbutrin 100mg  BID for mood.   Overall mood is good but a little more irritable and a little tremor since weaning off carbamazepine.  No recent outbursts, no recent alcohol intake, no depressed mood, no panic attacks.    Alcohol intake - none since establishing with liver clinic within the last few months.   Diabetes - taking Metformin 500mg  for diabetes.  Checks sugars occasionally.     His liver specialist wants him to stop Lipitor for the next 90 days until completion of hepatitis treatment.   Past Medical History:  Diagnosis Date  . Arthritis   . COPD (chronic obstructive pulmonary disease) ([place])   . Depression   . Diabetes mellitus without complication ([place]) 123456  . Hepatitis C   . Hypertension   . Mood disorder ([place])   . Obesity   . Obsessive compulsive disorder   . Tobacco use disorder    Current Outpatient Prescriptions on File Prior to Visit  Medication Sig Dispense Refill  . buPROPion (WELLBUTRIN) 100 MG tablet Take 1 tablet (100 mg total) by mouth 2 (two) times daily. 180 tablet 3  . citalopram (CELEXA) 40 MG tablet Take 1 tablet (40 mg total) by mouth daily. 90 tablet 3  . fish oil-omega-3 fatty acids 1000 MG capsule Take 1 g by mouth daily.      Marland Kitchen lisinopril-hydrochlorothiazide (PRINZIDE,ZESTORETIC) 20-12.5 MG tablet Take 1 tablet by mouth daily. 90 tablet 3  . metFORMIN (GLUCOPHAGE) 500 MG tablet Take 1 tablet (500 mg total) by mouth 2 (two) times daily with a meal. 180 tablet 3  . milk thistle 175 MG tablet Take 175 mg by mouth daily.  Reported on 12/31/2015    . Misc Natural Products (OSTEO BI-FLEX TRIPLE STRENGTH PO) Take by mouth. Reported on 12/31/2015    . omeprazole (PRILOSEC) 20 MG capsule Take 1 capsule (20 mg total) by mouth daily. 90 capsule 3  . verapamil (CALAN-SR) 180 MG CR tablet Take 1 tablet (180 mg total) by mouth daily. 90 tablet 3  . Vitamins C E (CRANBERRY CONCENTRATE PO) Take by mouth.    Marland Kitchen atorvastatin (LIPITOR) 20 MG tablet Take 1 tablet (20 mg total) by mouth daily. (Patient not taking: Reported on 05/18/2016) 90 tablet 3  . carbamazepine (TEGRETOL) 200 MG tablet TAKE 1 TABLET (200 MG TOTAL) BY MOUTH 3 (THREE) TIMES DAILY. (Patient not taking: Reported on 05/18/2016) 270 tablet 3   No current facility-administered medications on file prior to visit.    ROS as in subjective   Objective: BP 138/78 (BP Location: Right Arm, Patient Position: Sitting, Cuff Size: Large)   Pulse 98   Temp 98.4 F (36.9 C) (Tympanic)   Ht 5\' 8"  (1.727 m)   Wt 205 lb (93 kg)   SpO2 98%   BMI 31.17 kg/m   Wt Readings from Last 3 Encounters:  05/18/16 205 lb (93 kg)  01/26/16 202 lb 6.4 oz (91.8 kg)  12/31/15 198 lb (89.8 kg)   Gen: wd, wn, nad Heart rrr, normal  s1, s2, no murmurs Lungs decreased breath sounds in general, no dullness, wheezes, rhonchi or rales Ext: no edea Pulses WNL   Assessment: Encounter Diagnoses  Name Primary?  . Mood disorder in conditions classified elsewhere Yes  . Recurrent major depressive disorder, in partial remission ([place])   . Anxiety   . Obsessive-compulsive disorder, unspecified type   . Tobacco use disorder   . Alcohol abuse   . Diabetes mellitus with complication ([place])   . Essential hypertension   . Mixed hyperlipidemia      Plan: Discussed concerns, recent mood.  They are requesting some medication changes to save money, but given hepatitis clinics request to d/c carbamazepine due to interaction with hep C treatment, he has stopped carbamazepine.  discussed options for  therapy.  Of note, diabetes marker HgbA1C in office today is 6.7%.   recommendations as below. Glad to hear no recent alcohol use.  encouraged more exercise, healthy diet, but he current is eating a lot of fried and fast food.    Patient Instructions  Mood/depression  Now that you have weaned off Carbamezapine, begin Risperidone by taking 1/2 tablet twice daily for a week.  Then after a week increase Risperidone to a whole tablet (1mg ) twice daily ongoing  Continue Citalopram and Wellbutrin/bupropion for now  Let me know within the next 2 weeks how you are doing with mood, sleep, irritability  Blood Pressure  After 2 weeks you can make the change in your blood pressure medication from Verapamil to Norvasc for cost savings  If your verapamil is a tablet, cut in half and take 1/2 tablet daily for a week, then stop  At the same time you are coming off the verapamil, begin Amlodipine/Norvasc, 1/2 tablet daily for a week  After a week, go to 1 tablet daily of Amlodipine/Norvasc  Monitor blood pressure during this change such as using pharmacy BP cuff, or consider buying a cuff, or checking at doctor's offices.  By the time you have made both changes above, it will be a month from now  At that point, call back and if you are doing ok, we may consider at that time changing from Bupropion/Wellbutrin to Trazodone   Tyriece was seen today for diabetes.  Diagnoses and all orders for this visit:  Mood disorder in conditions classified elsewhere  Recurrent major depressive disorder, in partial remission ([place])  Anxiety  Obsessive-compulsive disorder, unspecified type  Tobacco use disorder  Alcohol abuse  Diabetes mellitus with complication ([facility]) -     Cancel: POCT glycosylated hemoglobin (Hb A1C) -     POCT glycosylated hemoglobin (Hb A1C)  Essential hypertension  Mixed hyperlipidemia  Other orders -     risperiDONE (RISPERDAL) 1 MG tablet; Take 1 tablet (1 mg total) by mouth 2  (two) times daily. -     amLODipine (NORVASC) 10 MG tablet; Take 1 tablet (10 mg total) by mouth daily.

## 2016-05-18 NOTE — Telephone Encounter (Signed)
Please call patient and caregiver - Here is what we found out, this liver fibroscan is offered here at the local hospitals.  I am not aware of any specific free program other than through orange card, or talking with [facility] directly.  So to get this paid for, I recommend he do one of the following:  1- apply for disability through social security administration to get on government insurance 2- apply for [employer] through social services which could also help him get this covered 3- or await liver doctor's efforts getting the fibroscan elsewhere.

## 2016-06-14 ENCOUNTER — Telehealth: Payer: Self-pay | Admitting: Family Medicine

## 2016-06-14 ENCOUNTER — Other Ambulatory Visit: Payer: Self-pay | Admitting: Medical

## 2016-06-14 MED ORDER — TRAZODONE HCL 100 MG PO TABS: ORAL_TABLET | ORAL | 0 refills | Status: DC

## 2016-06-14 NOTE — Telephone Encounter (Signed)
I sent trazodone to begin 1/2-1 tablet po QHS for mood and sleep.  As he begins this, only take Wellbutrin once daily for now.  We discussed possibly weaning off Wellbutrin.  Have them call back in 2-3 weeks to let me know how this transition is going.

## 2016-06-14 NOTE — Telephone Encounter (Signed)
[name] called and left message that Mohsen's blood pressure is good 122/86 and his mood is very good and wants a refill on the 180 Trazadone at 100 mg.  [place]   Pt ph [phone]

## 2016-06-15 NOTE — Telephone Encounter (Signed)
Left message to call back  

## 2016-06-16 NOTE — Telephone Encounter (Signed)
Pt called explain to him about the change in his meds. And for him follow up in 2-3 weeks

## 2016-07-13 ENCOUNTER — Other Ambulatory Visit: Payer: Self-pay | Admitting: Medical

## 2016-07-20 IMAGING — CR DG CHEST 2V
2 series · 2 of 2 positions shown · non-contrast
Comparison: PA and lateral chest 10/06/2014.

CLINICAL DATA: Smoker.  History of pneumonia

EXAM:
CHEST  2 VIEW

[w chest pa]
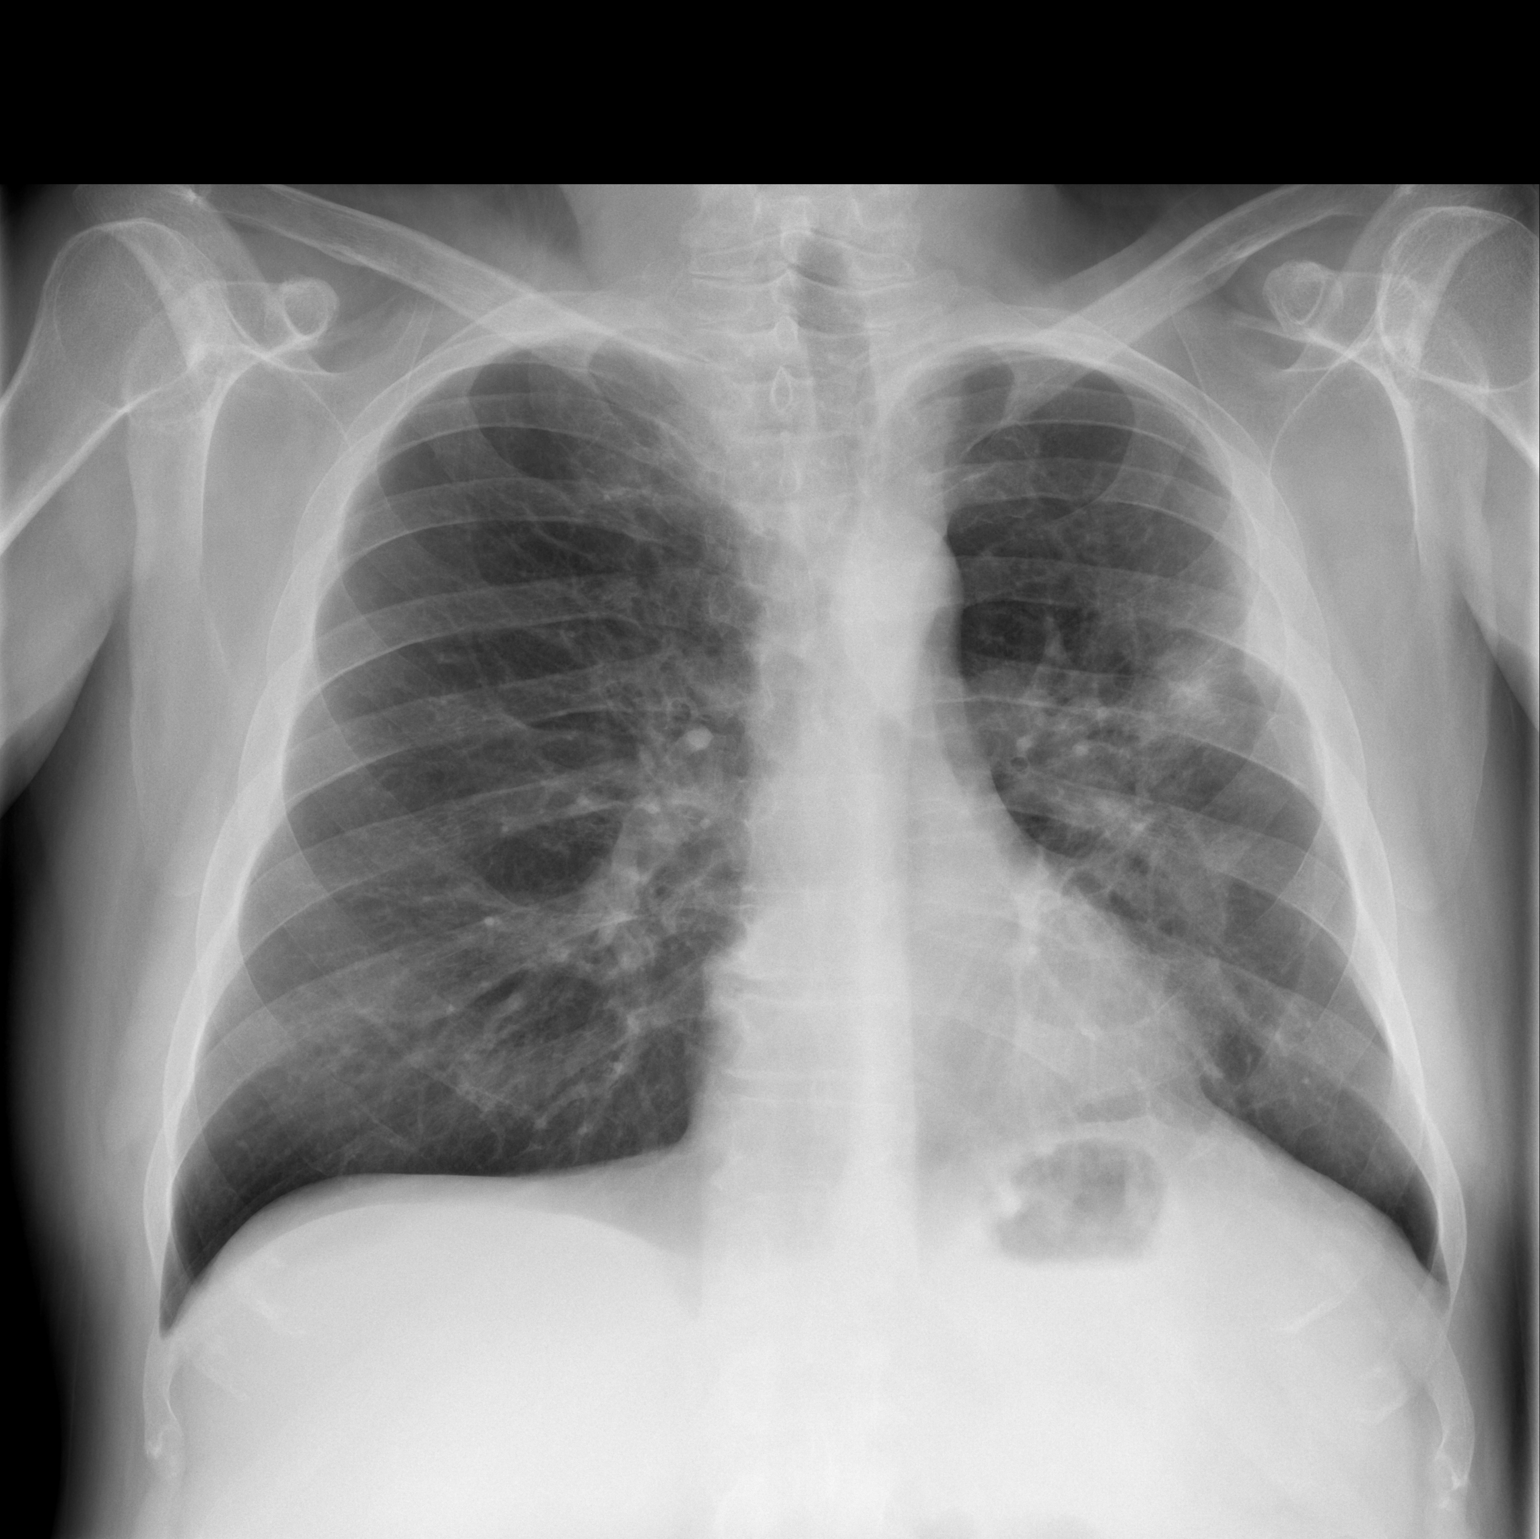

[w chest lat]
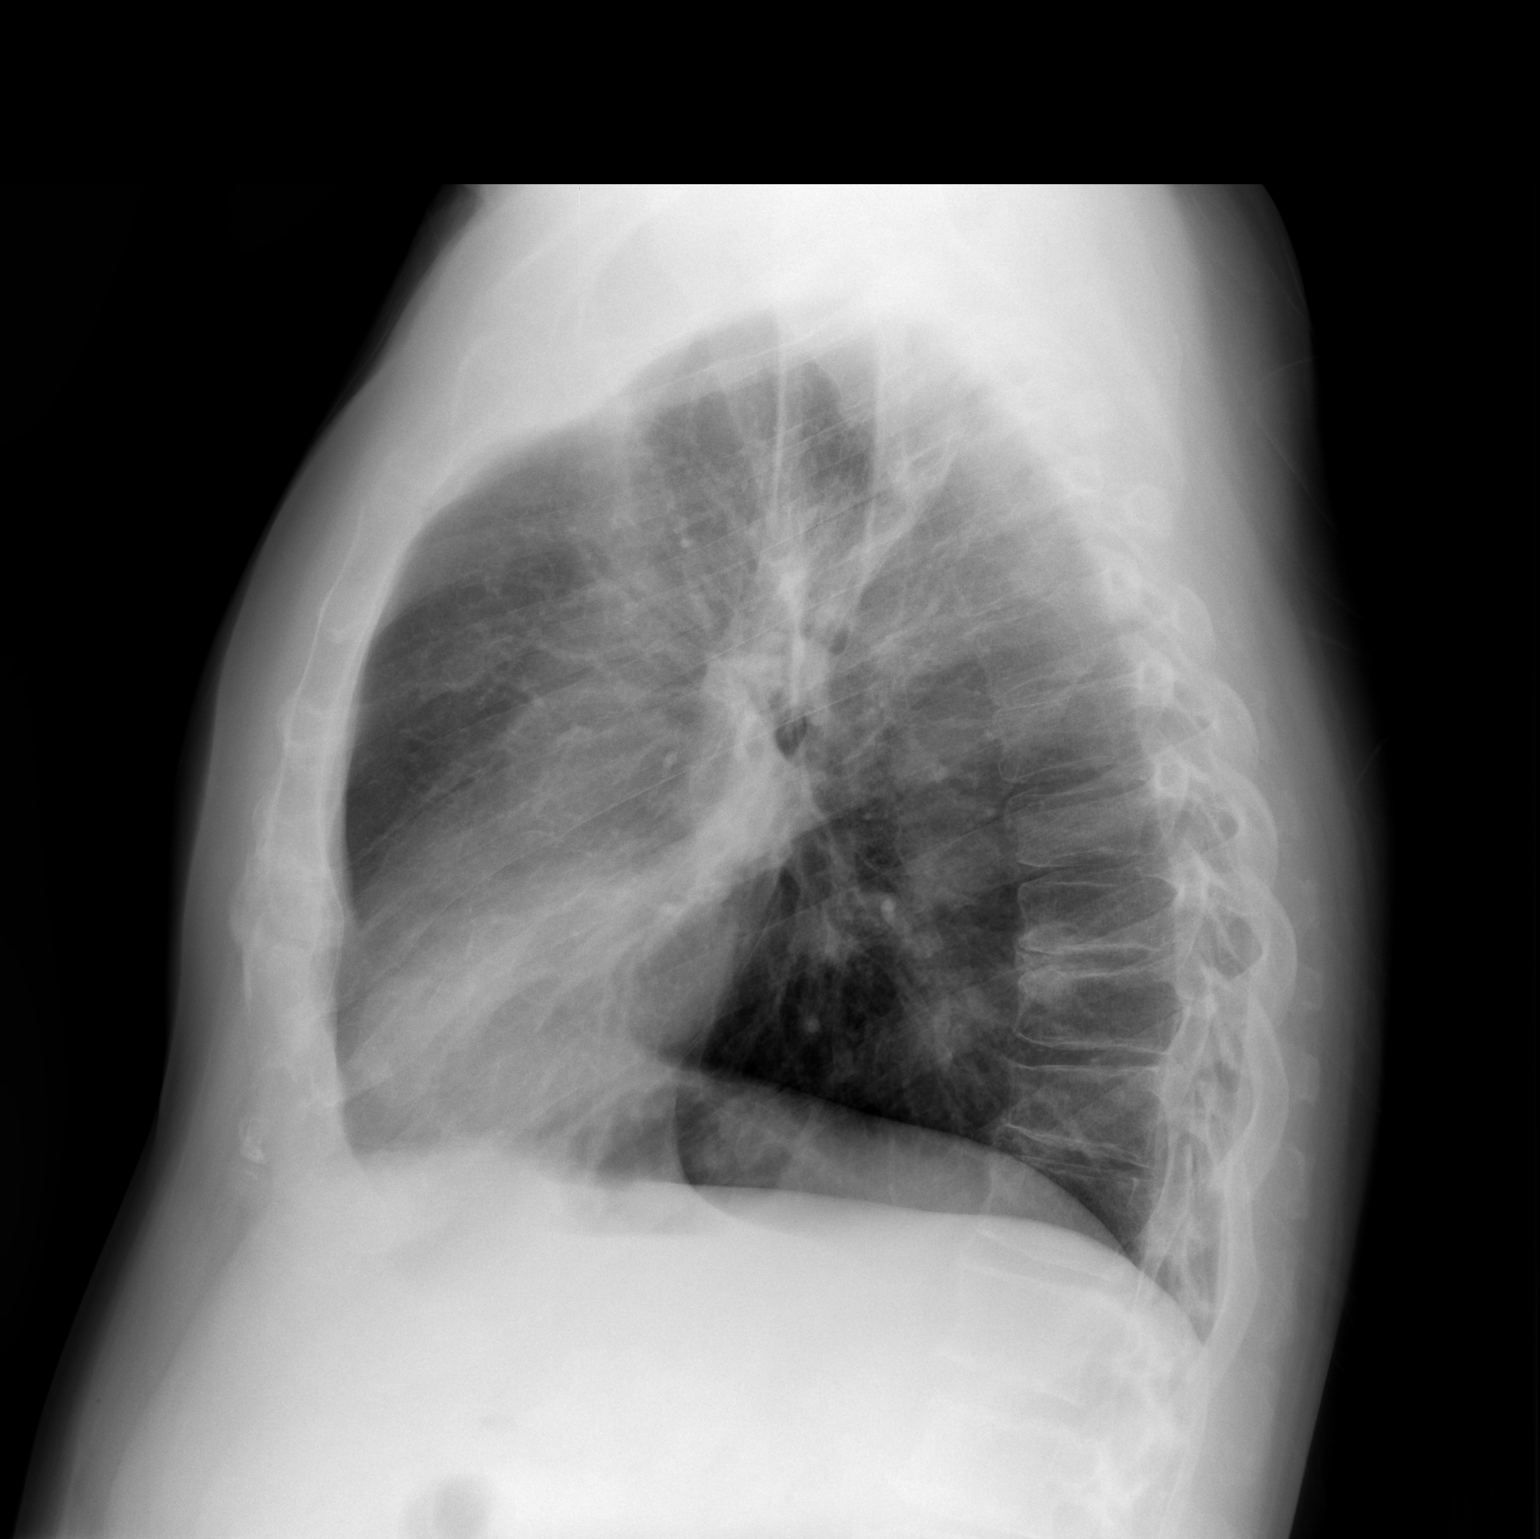

[2 of 2 positions shown; findings below may reference images not displayed]

FINDINGS: Airspace disease in the left chest seen on the prior examination is
markedly improved. Residual airspace opacity is seen in the lingula
and left upper lobe. The right lung is clear. Heart size is normal
IMPRESSION: Marked improvement in pneumonia on the left. Recommend continued
followup to clearing.

## 2016-08-15 ENCOUNTER — Other Ambulatory Visit: Payer: Self-pay | Admitting: Medical

## 2016-08-15 NOTE — Telephone Encounter (Signed)
Is this okay to refill? 

## 2016-08-20 ENCOUNTER — Emergency Department (HOSPITAL_COMMUNITY)
Admission: EM | Admit: 2016-08-20 | Discharge: 2016-08-21 | Disposition: A | Discharge status: Home / Self Care | Payer: Self-pay | Attending: Emergency Medicine | Admitting: Emergency Medicine

## 2016-08-20 ENCOUNTER — Encounter (HOSPITAL_COMMUNITY): Payer: Self-pay | Admitting: Emergency Medicine

## 2016-08-20 DIAGNOSIS — J449 Chronic obstructive pulmonary disease, unspecified: Secondary | ICD-10-CM | POA: Insufficient documentation

## 2016-08-20 DIAGNOSIS — Z7984 Long term (current) use of oral hypoglycemic drugs: Secondary | ICD-10-CM | POA: Insufficient documentation

## 2016-08-20 DIAGNOSIS — T50901A Poisoning by unspecified drugs, medicaments and biological substances, accidental (unintentional), initial encounter: Secondary | ICD-10-CM

## 2016-08-20 DIAGNOSIS — Z79899 Other long term (current) drug therapy: Secondary | ICD-10-CM | POA: Insufficient documentation

## 2016-08-20 DIAGNOSIS — E119 Type 2 diabetes mellitus without complications: Secondary | ICD-10-CM | POA: Insufficient documentation

## 2016-08-20 DIAGNOSIS — F17210 Nicotine dependence, cigarettes, uncomplicated: Secondary | ICD-10-CM | POA: Insufficient documentation

## 2016-08-20 DIAGNOSIS — I10 Essential (primary) hypertension: Secondary | ICD-10-CM | POA: Insufficient documentation

## 2016-08-20 DIAGNOSIS — T43501A Poisoning by unspecified antipsychotics and neuroleptics, accidental (unintentional), initial encounter: Secondary | ICD-10-CM | POA: Insufficient documentation

## 2016-08-20 LAB — CBG MONITORING, ED: Glucose-Capillary: 136 mg/dL — ABNORMAL HIGH (ref 65–99)

## 2016-08-20 LAB — COMPREHENSIVE METABOLIC PANEL
ALBUMIN: 4.4 g/dL (ref 3.5–5.0)
ALK PHOS: 48 U/L (ref 38–126)
ALT: 195 U/L — AB (ref 17–63)
ANION GAP: 12 (ref 5–15)
AST: 103 U/L — ABNORMAL HIGH (ref 15–41)
BILIRUBIN TOTAL: 0.8 mg/dL (ref 0.3–1.2)
BUN: 21 mg/dL — ABNORMAL HIGH (ref 6–20)
CALCIUM: 9.8 mg/dL (ref 8.9–10.3)
CO2: 22 mmol/L (ref 22–32)
Chloride: 102 mmol/L (ref 101–111)
Creatinine, Ser: 1.04 mg/dL (ref 0.61–1.24)
GFR calc non Af Amer: >60 mL/min (ref >60)
GLUCOSE: 133 mg/dL — AB (ref 65–99)
Potassium: 3.8 mmol/L (ref 3.5–5.1)
Sodium: 136 mmol/L (ref 135–145)
TOTAL PROTEIN: 6.8 g/dL (ref 6.5–8.1)

## 2016-08-20 LAB — CBC
HEMATOCRIT: 41.6 % (ref 39.0–52.0)
HEMOGLOBIN: 14.6 g/dL (ref 13.0–17.0)
MCH: 32 pg (ref 26.0–34.0)
MCHC: 35.1 g/dL (ref 30.0–36.0)
MCV: 91.2 fL (ref 78.0–100.0)
Platelets: 220 10*3/uL (ref 150–400)
RBC: 4.56 MIL/uL (ref 4.22–5.81)
RDW: 13.6 % (ref 11.5–15.5)
WBC: 7.3 10*3/uL (ref 4.0–10.5)

## 2016-08-20 LAB — ETHANOL: Alcohol, Ethyl (B): <5 mg/dL (ref <5)

## 2016-08-20 LAB — SALICYLATE LEVEL: Salicylate Lvl: <7 mg/dL (ref 2.8–30.0)

## 2016-08-20 LAB — ACETAMINOPHEN LEVEL

## 2016-08-20 MED ORDER — SODIUM CHLORIDE 0.9 % IV BOLUS (SEPSIS)
1000.0000 mL | INTRAVENOUS | Status: AC
  Administered 2016-08-20: 1000 mL via INTRAVENOUS

## 2016-08-20 NOTE — ED Provider Notes (Signed)
[facility] DEPT Provider Note   CSN: [id number] Arrival date & time: 08/20/16  1730     History   Chief Complaint Chief Complaint  Patient presents with  . Drug Overdose    HPI [name] is a 56 y.o. male.  Patient with past medical history of diabetes, hypertension, hyperlipidemia, depression, anxiety, and alcohol abuse presents emergency department with chief complaint of drug overdose. He is accompanied by his roommate, who states that the patient took approximately 15-21 mg risperidone tablets at 5 PM tonight. The patient states that he just "wanted to get some sleep." He denies any suicidal ideation. Denies ever having felt suicidal in the past. He denies any homicidal ideation. He states that he has felt very depressed. He also takes verapamil and trazodone, but states that he did not take these medications today. He states that he feels lightheaded now, but denies any other symptoms.   The history is provided by the patient. No language interpreter was used.    Past Medical History:  Diagnosis Date  . Arthritis   . COPD (chronic obstructive pulmonary disease) ([place])   . Depression   . Diabetes mellitus without complication ([place]) 123456  . Hepatitis C   . Hypertension   . Mood disorder ([place])   . Obesity   . Obsessive compulsive disorder   . Tobacco use disorder     Patient Active Problem List   Diagnosis Date Noted  . Alcohol abuse 05/18/2016  . Diabetes mellitus with complication ([place]) [id number]  . Obesity 12/31/2015  . Low HDL (under 40) 02/03/2015  . Tobacco use disorder 02/03/2015  . Noncompliance 02/03/2015  . Essential hypertension 09/30/2014  . Chronic hepatitis C without hepatic coma ([place]) 09/30/2014  . Dermatophytosis of foot 09/30/2014  . Insomnia 09/30/2014  . Toenail deformity 09/30/2014  . Mood disorder in conditions classified elsewhere 03/27/2014  . Hyperlipidemia 03/27/2014  . OCD (obsessive compulsive disorder) 11/12/2011  . GERD  (gastroesophageal reflux disease) 12/21/2010  . Depression 12/21/2010  . Anxiety 12/21/2010    Past Surgical History:  Procedure Laterality Date  . COLONOSCOPY     never       Home Medications    Prior to Admission medications   Medication Sig Start Date End Date Taking? Authorizing Provider  amLODipine (NORVASC) 10 MG tablet Take 1 tablet (10 mg total) by mouth daily. 05/18/16   Camelia Eng Tysinger, PA-C  atorvastatin (LIPITOR) 20 MG tablet Take 1 tablet (20 mg total) by mouth daily. Patient not taking: Reported on 05/18/2016 01/05/16   Camelia Eng Tysinger, PA-C  buPROPion (WELLBUTRIN) 100 MG tablet Take 1 tablet (100 mg total) by mouth 2 (two) times daily. 01/05/16   Camelia Eng Tysinger, PA-C  citalopram (CELEXA) 40 MG tablet Take 1 tablet (40 mg total) by mouth daily. 01/05/16   Camelia Eng Tysinger, PA-C  fish oil-omega-3 fatty acids 1000 MG capsule Take 1 g by mouth daily.      Historical Provider, MD  lisinopril-hydrochlorothiazide (PRINZIDE,ZESTORETIC) 20-12.5 MG tablet Take 1 tablet by mouth daily. 12/31/15   Camelia Eng Tysinger, PA-C  metFORMIN (GLUCOPHAGE) 500 MG tablet Take 1 tablet (500 mg total) by mouth 2 (two) times daily with a meal. 01/05/16   Camelia Eng Tysinger, PA-C  milk thistle 175 MG tablet Take 175 mg by mouth daily. Reported on 12/31/2015    Historical Provider, MD  Misc Natural Products (OSTEO BI-FLEX TRIPLE STRENGTH PO) Take by mouth. Reported on 12/31/2015    Historical Provider, MD  omeprazole (PRILOSEC) 20 MG capsule Take 1 capsule (20 mg total) by mouth daily. 01/05/16   Camelia Eng Tysinger, PA-C  risperiDONE (RISPERDAL) 1 MG tablet Take 1 tablet (1 mg total) by mouth 2 (two) times daily. 05/18/16   Camelia Eng Tysinger, PA-C  traZODone (DESYREL) 100 MG tablet TAKE 1/2 TO 1 TABLET BY MOUTH DAILY AT BEDTIME 08/16/16   Camelia Eng Tysinger, PA-C  verapamil (CALAN-SR) 180 MG CR tablet Take 1 tablet (180 mg total) by mouth daily. 12/31/15   Camelia Eng Tysinger, PA-C  Vitamins C E (CRANBERRY CONCENTRATE PO)  Take by mouth.    Historical Provider, MD    Family History No family history on file.  Social History Social History  Substance Use Topics  . Smoking status: Current Every Day Smoker    Packs/day: 3.00    Types: Cigarettes  . Smokeless tobacco: Never Used  . Alcohol use 12.6 oz/week    21 Shots of liquor per week     Allergies   Patient has no known allergies.   Review of Systems Review of Systems  All other systems reviewed and are negative.    Physical Exam Updated Vital Signs BP 100/70 (BP Location: Left Arm)   Pulse 112   Temp 97.7 F (36.5 C) (Oral)   Resp 22   Ht 5\' 8"  (1.727 m)   Wt 77.1 kg   SpO2 98%   BMI 25.85 kg/m   Physical Exam  Constitutional: He is oriented to person, place, and time. He appears well-developed and well-nourished.  HENT:  Head: Normocephalic and atraumatic.  Eyes: Conjunctivae and EOM are normal. Pupils are equal, round, and reactive to light. Right eye exhibits no discharge. Left eye exhibits no discharge. No scleral icterus.  Neck: Normal range of motion. Neck supple. No JVD present.  Cardiovascular: Regular rhythm and normal heart sounds.  Exam reveals no gallop and no friction rub.   No murmur heard. Tachycardia  Pulmonary/Chest: Effort normal and breath sounds normal. No respiratory distress. He has no wheezes. He has no rales. He exhibits no tenderness.  Abdominal: Soft. He exhibits no distension and no mass. There is no tenderness. There is no rebound and no guarding.  Musculoskeletal: Normal range of motion. He exhibits no edema or tenderness.  Neurological: He is alert and oriented to person, place, and time.  Skin: Skin is warm and dry.  Psychiatric: He has a normal mood and affect. His behavior is normal. Judgment and thought content normal.  Nursing note and vitals reviewed.    ED Treatments / Results  Labs (all labs ordered are listed, but only abnormal results are displayed) Labs Reviewed  CBG MONITORING, ED  - Abnormal; Notable for the following:       Result Value   Glucose-Capillary 136 (*)    All other components within normal limits  CBC  COMPREHENSIVE METABOLIC PANEL  ETHANOL  SALICYLATE LEVEL  ACETAMINOPHEN LEVEL  RAPID URINE DRUG SCREEN, HOSP PERFORMED    EKG  EKG Interpretation None       Radiology No results found.  Procedures Procedures (including critical care time)  Medications Ordered in ED Medications  sodium chloride 0.9 % bolus 1,000 mL (not administered)     Initial Impression / Assessment and Plan / ED Course  I have reviewed the triage vital signs and the nursing notes.  Pertinent labs & imaging results that were available during my care of the patient were reviewed by me and considered in my medical decision  making (see chart for details).     Patient with drug overdose. Took 15-20 risperidone 1 mg tablets at 5 PM. Poison control notified. Can cause CNS and respiratory depression, hypertension, dystonia, QT prolongation, and seizures. Poison control recommends giving fluids, benzos if needed, and observing on telemetry. Recommend 6 hour observation.  My exam, the patient is alert and oriented. He does not appear to be in any distress. He does feel little lightheaded, is mildly hypertensive at 100/70, will give fluids, will reassess.  Blood pressure improved with fluids. Patient has remained awake and alert throughout his emergency department stay. He has been observed for 6 hours. He has not had any seizures. He has no prolonged QT on EKG. I do not believe that the patient took the risperidone and an attempt to harm self. He continues to deny that he wanted to harm himself. I feel that he is stable for discharge. He has an appointment with his primary care doctor on Tuesday. His AST and ALT are noted to be elevated, but this is not a new finding for him, and he is at his baseline when compared to 6 months ago. Recommend primary care follow-up for this.  Otherwise, his laboratory workup is unremarkable.  Final Clinical Impressions(s) / ED Diagnoses   Final diagnoses:  Accidental drug overdose, initial encounter    New Prescriptions New Prescriptions   No medications on file     Montine Circle, PA-C 08/21/16 0013    Duffy Bruce, MD 08/21/16 1235

## 2016-08-20 NOTE — ED Notes (Signed)
Bladder scan 507 RN and PA aware

## 2016-08-20 NOTE — ED Triage Notes (Signed)
Pt has taken 15-20 risperidone 1 mg tablets approx at 5pm. Pt here with roommate-- pt denies suicide, but states I just want to go to sleep" states has been very depressed-- has recently changed medicine from wellbutrin to trazadone.   Poison control called -- risperidole can cause CNS and Resp depression Hypotension Dystonia QT proling Seizures.  Recommend -- fluids, benzos, obs.

## 2016-08-21 ENCOUNTER — Telehealth: Payer: Self-pay | Admitting: Medical

## 2016-08-21 LAB — RAPID URINE DRUG SCREEN, HOSP PERFORMED
Amphetamines: NOT DETECTED
Barbiturates: NOT DETECTED
Benzodiazepines: NOT DETECTED
Cocaine: POSITIVE — AB
Opiates: NOT DETECTED
Tetrahydrocannabinol: NOT DETECTED

## 2016-08-21 NOTE — Discharge Instructions (Signed)
Take your medications only as directed.    Return to the ER if you feel like harming yourself or anyone else.

## 2016-08-21 NOTE — Telephone Encounter (Signed)
Lets get him in for emergency dept f/u.

## 2016-08-21 NOTE — ED Notes (Signed)
Pt stable, understands discharge instructions, and reasons for return.   

## 2016-08-22 ENCOUNTER — Ambulatory Visit (INDEPENDENT_AMBULATORY_CARE_PROVIDER_SITE_OTHER): Payer: Self-pay | Admitting: Medical

## 2016-08-22 ENCOUNTER — Encounter: Payer: Self-pay | Admitting: Medical

## 2016-08-22 VITALS — BP 134/88 | HR 58 | Wt 176.4 lb

## 2016-08-22 DIAGNOSIS — F3341 Major depressive disorder, recurrent, in partial remission: Secondary | ICD-10-CM

## 2016-08-22 DIAGNOSIS — F419 Anxiety disorder, unspecified: Secondary | ICD-10-CM

## 2016-08-22 DIAGNOSIS — B182 Chronic viral hepatitis C: Secondary | ICD-10-CM

## 2016-08-22 DIAGNOSIS — F0630 Mood disorder due to known physiological condition, unspecified: Secondary | ICD-10-CM

## 2016-08-22 DIAGNOSIS — F429 Obsessive-compulsive disorder, unspecified: Secondary | ICD-10-CM

## 2016-08-22 DIAGNOSIS — G4700 Insomnia, unspecified: Secondary | ICD-10-CM

## 2016-08-22 MED ORDER — RISPERIDONE 1 MG PO TABS: 1.0000 mg | ORAL_TABLET | Freq: Two times a day (BID) | ORAL | 1 refills | Status: DC

## 2016-08-22 MED ORDER — TEMAZEPAM 7.5 MG PO CAPS: 7.5000 mg | ORAL_CAPSULE | Freq: Every evening | ORAL | 0 refills | Status: DC | PRN

## 2016-08-22 MED ORDER — BUPROPION HCL 100 MG PO TABS: 100.0000 mg | ORAL_TABLET | Freq: Two times a day (BID) | ORAL | 3 refills | Status: DC

## 2016-08-22 NOTE — Progress Notes (Signed)
Subjective: Chief Complaint  Patient presents with  . med check    need refill on meds, depression, weight loss    Here for med check.   Here with friend/caregiver [name].  He had been doing ok up until November.  In November due to concerns by hepatitis clinic we took him off Carbamezapine and changed him to Risperidone.  At that time we all added Trazodone to help with sleep.   While adding the trazodone, we had him cut back to once daily on Wellbutrin.  Did ok a few weeks, then started to have a slow downward trend in mood, activity, and has gotten depressed, down in his mood.  Also had worsening trouble getting and staying asleep.  Still has refrained from alcohol for months!  Since last visit has had f/u with hepatitis clinic and planning treatment.   He also had a scare within past month.   hepatitis clinic noticed area of concern on his liver, had MRI done after CT.  He was very worried he had cancer.  Denies SI/HI.  He was seen in the emergency dept a few nights ago for overdose.  He had been having trouble sleeping, so out of the blue he took several risperidone the other night thinking this would help him rest.  Was held in observation.   He has f/u with hepatitis clinch.  No other aggravating or relieving factors. No other complaint.   Past Medical History:  Diagnosis Date  . Arthritis   . COPD (chronic obstructive pulmonary disease) ([place])   . Depression   . Diabetes mellitus without complication ([place]) 123456  . Hepatitis C   . Hypertension   . Mood disorder ([place])   . Obesity   . Obsessive compulsive disorder   . Tobacco use disorder    Current Outpatient Prescriptions on File Prior to Visit  Medication Sig Dispense Refill  . amLODipine (NORVASC) 10 MG tablet Take 1 tablet (10 mg total) by mouth daily. 90 tablet 1  . atorvastatin (LIPITOR) 20 MG tablet Take 1 tablet (20 mg total) by mouth daily. 90 tablet 3  . citalopram (CELEXA) 40 MG tablet Take 1 tablet (40 mg total) by mouth  daily. 90 tablet 3  . fish oil-omega-3 fatty acids 1000 MG capsule Take 1 g by mouth daily.      Marland Kitchen lisinopril-hydrochlorothiazide (PRINZIDE,ZESTORETIC) 20-12.5 MG tablet Take 1 tablet by mouth daily. 90 tablet 3  . metFORMIN (GLUCOPHAGE) 500 MG tablet Take 1 tablet (500 mg total) by mouth 2 (two) times daily with a meal. 180 tablet 3  . Misc Natural Products (OSTEO BI-FLEX TRIPLE STRENGTH PO) Take by mouth. Reported on 12/31/2015    . traZODone (DESYREL) 100 MG tablet TAKE 1/2 TO 1 TABLET BY MOUTH DAILY AT BEDTIME 90 tablet 0   No current facility-administered medications on file prior to visit.    ROS as in subjective  Objective BP 134/88   Pulse (!) 58   Wt 176 lb 6.4 oz (80 kg)   SpO2 97%   BMI 26.82 kg/m   Gen: wd, WN, nad, seems down in mood, not joking and cheery as usual    Assessment: Encounter Diagnoses  Name Primary?  . Recurrent major depressive disorder, in partial remission ([place]) Yes  . Anxiety   . Mood disorder in conditions classified elsewhere   . Obsessive-compulsive disorder, unspecified type   . Chronic hepatitis C without hepatic coma ([place])   . Insomnia, unspecified type     Plan:  Discussed his concerns, worsening of mood, worse insomnia, and medication tried thus far.   Reviewed the recent ED notes, labs.   I never intended for him to completely stop Wellbutrin but he did.  Thus, we will add back Wellbutrin 100mg  BID, will c/t trazodone 100mg  QHS for now, c/t risperidone 1mg  BID, c/t citalopram QHS.  Short term can use Temazepam as sleep aid while he gets use to being back on Wellbutrin.  Caregiver with safeguard Temazepam and keep this in his.  F/u by phone or in person in 1 week.  He and richard agree with recommendations and plan.    Patient Instructions  Begin back on Bupropion/Wellbutrin, 1 tablet twice daily Continue Trazodone 100mg , 1 tablet at bedtime daily for sleep Continue all other medications as usual  SHORT TERM for the next few days, he can  also begin Temazepam 1 capsule at bedtime for sleep aid.      Cipriano was seen today for med check.  Diagnoses and all orders for this visit:  Recurrent major depressive disorder, in partial remission ([place])  Anxiety  Mood disorder in conditions classified elsewhere  Obsessive-compulsive disorder, unspecified type  Chronic hepatitis C without hepatic coma (HCC)  Insomnia, unspecified type  Other orders -     buPROPion (WELLBUTRIN) 100 MG tablet; Take 1 tablet (100 mg total) by mouth 2 (two) times daily. -     temazepam (RESTORIL) 7.5 MG capsule; Take 1 capsule (7.5 mg total) by mouth at bedtime as needed for sleep. -     Discontinue: risperiDONE (RISPERDAL) 1 MG tablet; Take 1 tablet (1 mg total) by mouth 2 (two) times daily. -     risperiDONE (RISPERDAL) 1 MG tablet; Take 1 tablet (1 mg total) by mouth 2 (two) times daily.

## 2016-08-22 NOTE — Patient Instructions (Signed)
Begin back on Bupropion/Wellbutrin, 1 tablet twice daily Continue Trazodone 100mg , 1 tablet at bedtime daily for sleep Continue all other medications as usual  SHORT TERM for the next few days, he can also begin Temazepam 1 capsule at bedtime for sleep aid.

## 2016-08-23 ENCOUNTER — Encounter: Payer: Self-pay | Admitting: Medical

## 2016-08-25 ENCOUNTER — Telehealth: Payer: Self-pay

## 2016-08-25 NOTE — Telephone Encounter (Signed)
I gave them a printed script to add temazepam short term.  Have they tried this at bedtime the last 2 days?   C/t trazodone and we just added back Wellbutrin. It may take a week or so for this to build up in the system.   Try temazepam again tonight.  If this doesn't help, let me know and we can try something else.

## 2016-08-25 NOTE — Telephone Encounter (Signed)
Pt 's aide called and said that trazodone is not helping  Him sleep . He was wants to know if he can give him  2 pills at bed time?

## 2016-08-25 NOTE — Telephone Encounter (Signed)
Talked to [name] he said yes they have been trying med the past two days and its not working. I finished reading your not and he verbalized understandingf

## 2016-08-26 ENCOUNTER — Telehealth: Payer: Self-pay | Admitting: Medical

## 2016-08-26 ENCOUNTER — Other Ambulatory Visit: Payer: Self-pay | Admitting: Medical

## 2016-08-26 MED ORDER — ZOLPIDEM TARTRATE 10 MG PO TABS: 10.0000 mg | ORAL_TABLET | Freq: Every evening | ORAL | 0 refills | Status: DC | PRN

## 2016-08-26 NOTE — Telephone Encounter (Signed)
Please call   Still not able to sleep very well with sleeping pill  Wants to know if can change dose or change to diferent med

## 2016-08-26 NOTE — Telephone Encounter (Signed)
[facility] and notified pt's care giver.

## 2016-08-26 NOTE — Telephone Encounter (Signed)
Call out Ambien sleep aid, 1 nightly, #15, no refill.   Stop Temazepam, and c/t trazodone.  If sleep not improving within a week, then may need to see neurology regarding sleep

## 2016-09-15 ENCOUNTER — Telehealth: Payer: Self-pay | Admitting: Medical

## 2016-09-15 ENCOUNTER — Other Ambulatory Visit: Payer: Self-pay | Admitting: Medical

## 2016-09-15 NOTE — Telephone Encounter (Signed)
Call it out if its working good in regards to sleep

## 2016-09-15 NOTE — Telephone Encounter (Signed)
Requesting refill on Temazepam 7.5 mg

## 2016-09-15 NOTE — Telephone Encounter (Signed)
See refill message, call it out

## 2016-09-15 NOTE — Telephone Encounter (Signed)
Is this okay to refill? 

## 2016-09-16 NOTE — Telephone Encounter (Signed)
Called in refill to pharmacy 

## 2016-09-16 NOTE — Telephone Encounter (Signed)
Called into the pharmacy.

## 2016-09-19 ENCOUNTER — Telehealth: Payer: Self-pay | Admitting: Family Medicine

## 2016-09-19 NOTE — Telephone Encounter (Signed)
Pt left message sleep med needed.  Called pt back and no answer.  It looks like med called in last Friday.

## 2016-09-27 ENCOUNTER — Encounter: Payer: Self-pay | Admitting: Medical

## 2016-09-27 ENCOUNTER — Ambulatory Visit (INDEPENDENT_AMBULATORY_CARE_PROVIDER_SITE_OTHER): Payer: Self-pay | Admitting: Medical

## 2016-09-27 VITALS — BP 110/60 | HR 76 | Ht 66.0 in | Wt 163.6 lb

## 2016-09-27 DIAGNOSIS — R4189 Other symptoms and signs involving cognitive functions and awareness: Secondary | ICD-10-CM

## 2016-09-27 DIAGNOSIS — G4700 Insomnia, unspecified: Secondary | ICD-10-CM

## 2016-09-27 DIAGNOSIS — B182 Chronic viral hepatitis C: Secondary | ICD-10-CM

## 2016-09-27 DIAGNOSIS — F17200 Nicotine dependence, unspecified, uncomplicated: Secondary | ICD-10-CM

## 2016-09-27 DIAGNOSIS — F0630 Mood disorder due to known physiological condition, unspecified: Secondary | ICD-10-CM

## 2016-09-27 DIAGNOSIS — F419 Anxiety disorder, unspecified: Secondary | ICD-10-CM

## 2016-09-27 DIAGNOSIS — K739 Chronic hepatitis, unspecified: Secondary | ICD-10-CM | POA: Insufficient documentation

## 2016-09-27 DIAGNOSIS — R634 Abnormal weight loss: Secondary | ICD-10-CM

## 2016-09-27 DIAGNOSIS — F3341 Major depressive disorder, recurrent, in partial remission: Secondary | ICD-10-CM

## 2016-09-27 LAB — TSH: TSH: 0.9 mIU/L (ref 0.40–4.50)

## 2016-09-27 NOTE — Progress Notes (Signed)
Subjective: Chief Complaint  Patient presents with  . weightn loss    weight loss    Here for weight loss.  Accompanied by his caregiver.  He is undergoing Harvoni treatment through [facility] hepatitis clinic.   They observed he is losing weight, wanted him to f/u here to evaluate/discuss.  Regarding weight loss, has changed eating habits in recent months.  Things seemed to change after he came off carbamazepine.   No blood in stool, no blood in urine, no hemoptysis,no vomiting blood.  He has a male friend of 4 years that moved away in 07/2016.  They think he started losing weight in January 2018.  He had a diet change then.  Now eats 2 times daily on average, lots of soup.   Some days has trouble with ADLs, seems to have memory and cognition issues. Other days is fine.   Yesterday was struggling to get shoes and belt on.  Couldn't remember room number yesterday. This happens about twice weekly.   Mood has been more blah, not even fiesty of late.    Had labs at [facility] yesterday.  Had vaccine for Hep A and B yesterday.    Contact info: [facility] hepatology -[place], [place], with Rayvon Char MD [phone].  Sleep is improved from last visit, has 1-2 nights per week of not sleeping well, but has seen improvement on med changes, when we added Temazepam.   Still uses trazodone although trazodone by itself didn't seem to work.    Past Medical History:  Diagnosis Date  . Arthritis   . COPD (chronic obstructive pulmonary disease) ([place])   . Depression   . Diabetes mellitus without complication ([place]) 123456  . Hepatitis C   . Hypertension   . Mood disorder ([place])   . Obesity   . Obsessive compulsive disorder   . Tobacco use disorder    Current Outpatient Prescriptions on File Prior to Visit  Medication Sig Dispense Refill  . amLODipine (NORVASC) 10 MG tablet Take 1 tablet (10 mg total) by mouth daily. 90 tablet 1  . atorvastatin (LIPITOR) 20 MG tablet Take 1 tablet (20 mg total) by mouth daily. 90 tablet  3  . buPROPion (WELLBUTRIN) 100 MG tablet Take 1 tablet (100 mg total) by mouth 2 (two) times daily. 180 tablet 3  . citalopram (CELEXA) 40 MG tablet Take 1 tablet (40 mg total) by mouth daily. 90 tablet 3  . fish oil-omega-3 fatty acids 1000 MG capsule Take 1 g by mouth daily.      Marland Kitchen lisinopril-hydrochlorothiazide (PRINZIDE,ZESTORETIC) 20-12.5 MG tablet Take 1 tablet by mouth daily. 90 tablet 3  . metFORMIN (GLUCOPHAGE) 500 MG tablet Take 1 tablet (500 mg total) by mouth 2 (two) times daily with a meal. 180 tablet 3  . Misc Natural Products (OSTEO BI-FLEX TRIPLE STRENGTH PO) Take by mouth. Reported on 12/31/2015    . risperiDONE (RISPERDAL) 1 MG tablet Take 1 tablet (1 mg total) by mouth 2 (two) times daily. 180 tablet 1  . temazepam (RESTORIL) 7.5 MG capsule TAKE 1 CAPSULE BY MOUTH DAILY AT BEDTIME AS NEEDED FOR SLEEP 30 capsule 1  . traZODone (DESYREL) 100 MG tablet TAKE 1/2 TO 1 TABLET BY MOUTH DAILY AT BEDTIME 90 tablet 0  . zolpidem (AMBIEN) 10 MG tablet Take 1 tablet (10 mg total) by mouth at bedtime as needed for sleep. 15 tablet 0   No current facility-administered medications on file prior to visit.    ROS as in subjective  Objective: BP 110/60   Pulse 76   Ht 5\' 6"  (1.676 m)   Wt 163 lb 9.6 oz (74.2 kg)   SpO2 98%   BMI 26.41 kg/m   Wt Readings from Last 3 Encounters:  09/27/16 163 lb 9.6 oz (74.2 kg)  08/22/16 176 lb 6.4 oz (80 kg)  08/20/16 170 lb (77.1 kg)   October was 205lb.  General appearance: alert, no distress, WD/WN  HEENT: normocephalic, sclerae anicteric, TMs pearly, nares patent, no discharge or erythema, pharynx normal Oral cavity: MMM, no lesions Neck: supple, no lymphadenopathy, no thyromegaly, no masses, no bruits Heart: RRR, normal S1, S2, no murmurs Lungs: CTA bilaterally, no wheezes, rhonchi, or rales Pulses: 2+ symmetric, upper and lower extremities, normal cap refill Neuro: CN2-12 intact, slight resting tremor in general, otherwise nonfocal  exam Psych: affect is down and depressed, not his usual self     Assessment; Encounter Diagnoses  Name Primary?  . Weight loss Yes  . Chronic hepatitis C without hepatic coma ([place])   . Recurrent major depressive disorder, in partial remission ([place])   . Mood disorder in conditions classified elsewhere   . Tobacco use disorder   . Anxiety   . Insomnia, unspecified type   . Alteration in cognition     Plan: MMSE, PHQ9, and mood disorder questionnaire reviewed. Negative mood disorder screen, PHQ9 score of 11, MMSE score of 27/30.    Discussed the concerns.  Reviewed recent CBC and CMET labs in the [facility] system, reviewed recent notes from [facility] hepatitis clinic.   Weight loss - of note, he had + cocaine finding on drug screen in 08/2016 ED visit.  He denies using this now.   He seems depressed, his friend of 4 years moved in 08/2016.  He is on medications that could be contributing to them.  Labs today.  Depression, anxiety, mood disorder - c/t same medications, but it may be time to see psychiatry.   We have tried to work with him in recent years due to cost concerns, inability to go to psychiatry.   But it may be more beneficial to have psychiatry consult on him now.  Insomnia - c/t trazodone, temazepam, as this seems to be working reasonably well for the time being.  Chronic hep C - c/t f/u with hepatitis clinic at [facility] was seen today for weightn loss.  Diagnoses and all orders for this visit:  Weight loss -     PSA -     Vitamin B12 -     TSH -     Ammonia  Chronic hepatitis C without hepatic coma (HCC) -     PSA -     Vitamin B12 -     TSH -     Ammonia  Recurrent major depressive disorder, in partial remission (HCC) -     PSA -     Vitamin B12 -     TSH -     Ammonia  Mood disorder in conditions classified elsewhere -     PSA -     Vitamin B12 -     TSH -     Ammonia  Tobacco use disorder -     PSA -     Vitamin B12 -     TSH -      Ammonia  Anxiety -     PSA -     Vitamin B12 -     TSH -     Ammonia  Insomnia, unspecified type -     PSA -     Vitamin B12 -     TSH -     Ammonia  Alteration in cognition

## 2016-09-28 ENCOUNTER — Other Ambulatory Visit: Payer: Self-pay | Admitting: Medical

## 2016-09-28 DIAGNOSIS — R410 Disorientation, unspecified: Secondary | ICD-10-CM

## 2016-09-28 DIAGNOSIS — R4182 Altered mental status, unspecified: Secondary | ICD-10-CM

## 2016-09-28 DIAGNOSIS — R634 Abnormal weight loss: Secondary | ICD-10-CM

## 2016-09-28 LAB — VITAMIN B12: Vitamin B-12: 414 pg/mL (ref 200–1100)

## 2016-09-28 LAB — PSA: PSA: 0.9 ng/mL (ref <=4.0)

## 2016-09-28 LAB — AMMONIA

## 2016-09-29 ENCOUNTER — Other Ambulatory Visit (INDEPENDENT_AMBULATORY_CARE_PROVIDER_SITE_OTHER): Payer: Self-pay

## 2016-09-29 DIAGNOSIS — R634 Abnormal weight loss: Secondary | ICD-10-CM

## 2016-09-29 DIAGNOSIS — R410 Disorientation, unspecified: Secondary | ICD-10-CM

## 2016-09-29 DIAGNOSIS — R4182 Altered mental status, unspecified: Secondary | ICD-10-CM

## 2016-09-30 LAB — AMMONIA: Ammonia: 32 umol/L (ref <=47)

## 2016-09-30 LAB — HIV ANTIBODY (ROUTINE TESTING W REFLEX): HIV 1&2 Ab, 4th Generation: NONREACTIVE

## 2016-10-07 ENCOUNTER — Other Ambulatory Visit: Payer: Self-pay | Admitting: Medical

## 2016-10-07 ENCOUNTER — Telehealth: Payer: Self-pay | Admitting: Medical

## 2016-10-07 MED ORDER — TEMAZEPAM 15 MG PO CAPS: 15.0000 mg | ORAL_CAPSULE | Freq: Every evening | ORAL | 2 refills | Status: DC | PRN

## 2016-10-07 NOTE — Telephone Encounter (Signed)
Please call out Temazepam 15mg , higher dose.  There was a cost issue per letter Duane Boston his caregiver sent in.   Also see if they have heard back from [facility] about colonoscopy, psychiatry consults?

## 2016-10-11 NOTE — Telephone Encounter (Signed)
Pt had called about  Sleeping meds  Needing a  Refill , called l/m for pt call me back  Find out which one he needs .

## 2016-10-12 NOTE — Telephone Encounter (Signed)
Called and spoke with phamacy gave him refills on meds.

## 2016-11-08 ENCOUNTER — Telehealth: Payer: Self-pay | Admitting: Medical

## 2016-11-08 ENCOUNTER — Other Ambulatory Visit: Payer: Self-pay | Admitting: Medical

## 2016-11-08 NOTE — Telephone Encounter (Signed)
Can he have a refill on this. 

## 2016-11-08 NOTE — Telephone Encounter (Signed)
As a follow up from his last visit, see if he has established with psychiatry yet at [facility].  Last visit I asked him to request appt with psychiatry at [facility].  Of note, I had a refill request today on Trazodone and Citalopram which I refilled

## 2016-11-09 NOTE — Telephone Encounter (Signed)
Called in to [facility]

## 2016-11-10 NOTE — Telephone Encounter (Signed)
Called and l/m for pt call me back about this. 

## 2016-11-10 NOTE — Telephone Encounter (Signed)
Forwarding

## 2016-11-10 NOTE — Telephone Encounter (Signed)
Spoke with Domanique's caregiver and he said that know one has contacted him about the this. I called and spoke with [facility] , I told that they have some one staff that can see him by the name of Butch Penny. After speaking to the  The hep C clinic that they were all trying to get him set up but had no success because their no one on staff that can really help him  , but they actively  Working on  Get him set up some one.

## 2016-11-10 NOTE — Telephone Encounter (Signed)
The other option would be to establish with psychiatry here in [place] and work out Counsellor.   They can call for appt cost for the following: [facility]

## 2016-11-18 ENCOUNTER — Telehealth: Payer: Self-pay | Admitting: Medical

## 2016-11-18 NOTE — Telephone Encounter (Signed)
Pt's caregiver, [name], called stating that pt has finished Hep C (Harvoni) injections so [name] would like for pt to be changed back to Carbamazepine sine that seen to work better that Risperidone. [name] said pr was switched to Risperidone due to Harvoni injections. Send med to [facility]

## 2016-11-21 ENCOUNTER — Other Ambulatory Visit: Payer: Self-pay | Admitting: Medical

## 2016-11-21 NOTE — Telephone Encounter (Signed)
Called and l/m for patient  To give [place] a call back.

## 2016-11-21 NOTE — Telephone Encounter (Signed)
Spoke with the aide about this. Gave him the names of the places

## 2016-11-22 NOTE — Telephone Encounter (Signed)
Did his liver doctor give him the ok on this?

## 2016-11-23 NOTE — Telephone Encounter (Signed)
Called [name] and left message to call our office

## 2016-12-20 ENCOUNTER — Encounter: Payer: Self-pay | Admitting: Medical

## 2016-12-20 ENCOUNTER — Ambulatory Visit (INDEPENDENT_AMBULATORY_CARE_PROVIDER_SITE_OTHER): Payer: Self-pay | Admitting: Medical

## 2016-12-20 VITALS — BP 118/80 | HR 78 | Wt 157.6 lb

## 2016-12-20 DIAGNOSIS — E118 Type 2 diabetes mellitus with unspecified complications: Secondary | ICD-10-CM

## 2016-12-20 DIAGNOSIS — F0630 Mood disorder due to known physiological condition, unspecified: Secondary | ICD-10-CM

## 2016-12-20 DIAGNOSIS — C6951 Malignant neoplasm of right lacrimal gland and duct: Secondary | ICD-10-CM

## 2016-12-20 DIAGNOSIS — F3341 Major depressive disorder, recurrent, in partial remission: Secondary | ICD-10-CM

## 2016-12-20 DIAGNOSIS — F5104 Psychophysiologic insomnia: Secondary | ICD-10-CM

## 2016-12-20 LAB — POCT GLYCOSYLATED HEMOGLOBIN (HGB A1C): Hemoglobin A1C: 5

## 2016-12-20 MED ORDER — TEMAZEPAM 15 MG PO CAPS: 15.0000 mg | ORAL_CAPSULE | Freq: Every evening | ORAL | 3 refills | Status: DC | PRN

## 2016-12-20 NOTE — Patient Instructions (Signed)
[facility] [phone] [address] [place], [place], [place] [address]  Lina Sayre, therapist Dr. Lynder Parents, psychiatrist Dr. Milana Huntsman, child psychiatrist

## 2016-12-20 NOTE — Progress Notes (Signed)
Subjective: Chief Complaint  Patient presents with  . needs  refill on his sleep meds    need a refill on his sleep medicine , also he has eye cancer that he is being treated for    Here for f/u on insomnia.  Still doing ok on Trazodone 1 tablet QHS and Temazepam 15mg  QHS.   This seems to work ok, compared to 2 visits ago.  He had some recent labs done at [facility].    Hep C seems to be cured after harvoni treatment  Diabetes - compliant with medication, trying to keep diet consistent, walking some for exercise, but not checking sugars.     Mood, depression - still flat, not his self as he was a year ago.   Thinks all seemed to change when Harvoni was started and he had to come off Carbamezapine.  Still taking Carbamezapine 200mg  TID, Celexa 40mg  daily and Wellbutrin 100mg  BID.   He has a new diagnosis of cancer of right eye tear duct, will be seeing a specialist who is an eye doctor and plastic surgery specialist.   Past Medical History:  Diagnosis Date  . Arthritis   . COPD (chronic obstructive pulmonary disease) ([place])   . Depression   . Diabetes mellitus without complication ([place]) 30/86  . Hepatitis C   . Hypertension   . Mood disorder ([place])   . Obesity   . Obsessive compulsive disorder   . Tobacco use disorder    Current Outpatient Prescriptions on File Prior to Visit  Medication Sig Dispense Refill  . amLODipine (NORVASC) 10 MG tablet Take 1 tablet (10 mg total) by mouth daily. 90 tablet 1  . buPROPion (WELLBUTRIN) 100 MG tablet Take 1 tablet (100 mg total) by mouth 2 (two) times daily. 180 tablet 3  . carbamazepine (TEGRETOL) 200 MG tablet TAKE 1 TABLET (200 MG TOTAL) BY MOUTH 3 (THREE) TIMES DAILY. 270 tablet 1  . citalopram (CELEXA) 40 MG tablet TAKE 1 TABLET (40 MG TOTAL) BY MOUTH DAILY. 90 tablet 2  . lisinopril-hydrochlorothiazide (PRINZIDE,ZESTORETIC) 20-12.5 MG tablet Take 1 tablet by mouth daily. 90 tablet 3  . metFORMIN (GLUCOPHAGE) 500 MG tablet Take 1 tablet (500 mg  total) by mouth 2 (two) times daily with a meal. 180 tablet 3  . Misc Natural Products (OSTEO BI-FLEX TRIPLE STRENGTH PO) Take by mouth. Reported on 12/31/2015    . traZODone (DESYREL) 100 MG tablet TAKE 1/2 TO 1 TABLET BY MOUTH DAILY AT BEDTIME 90 tablet 0  . risperiDONE (RISPERDAL) 1 MG tablet Take 1 tablet (1 mg total) by mouth 2 (two) times daily. (Patient not taking: Reported on 12/20/2016) 180 tablet 1   No current facility-administered medications on file prior to visit.     ROS as in subjective    Objective: BP 118/80   Pulse 78   Wt 157 lb 9.6 oz (71.5 kg)   SpO2 96%   BMI 25.44 kg/m   Wt Readings from Last 3 Encounters:  12/20/16 157 lb 9.6 oz (71.5 kg)  09/27/16 163 lb 9.6 oz (74.2 kg)  08/22/16 176 lb 6.4 oz (80 kg)   Gen: wd, wn, nad Mood - flat, answers questions appropriate otherwise Right medial canthus of eye with large growth, 2cm diameter otherwise not examined   Assessment: Encounter Diagnoses  Name Primary?  . Diabetes mellitus with complication ([place]) Yes  . Chronic insomnia   . Recurrent major depressive disorder, in partial remission ([place])   . Mood disorder in conditions  classified elsewhere   . Cancer of tear duct, right ([place])       Plan: Diabetes - HgbA1C stable.  C/t same medications.  Reviewed labs from 10/2016 from [facility], c/t healthy diet, exercise  Insomnia - c/t temazepam and trazodone daily  Depression, mood disorder - advised he c/t same medication but establish with psychiatry to help get his regimen updated to improve mood.   recommended he establish with [facility].  New diagnosis of cancer -  Expressed sympathy for his recent diagnosis, but he will f/u with specialist soon.   [place] was seen today for needs  refill on his sleep meds.  Diagnoses and all orders for this visit:  Diabetes mellitus with complication ([place]) -     HgB A1c  Chronic insomnia  Recurrent major depressive disorder, in partial remission  ([place])  Mood disorder in conditions classified elsewhere  Cancer of tear duct, right ([place])  Other orders -     temazepam (RESTORIL) 15 MG capsule; Take 1 capsule (15 mg total) by mouth at bedtime as needed for sleep.

## 2017-01-30 ENCOUNTER — Other Ambulatory Visit: Payer: Self-pay | Admitting: Medical

## 2017-01-30 NOTE — Telephone Encounter (Signed)
Can pt have a refill on this 

## 2017-05-05 ENCOUNTER — Telehealth: Payer: Self-pay | Admitting: Medical

## 2017-05-05 NOTE — Telephone Encounter (Signed)
Mr. [name] called for refills. He needs bupropion 100 mg, citalopram 40mg , lisinopril-hctz 20-12.5, metformin 500mg , temazepam 15 mg and trazodone 100 mg. HE ALSO STATES that he needs refills on Verapamil 100 mg. Medication list shows that he is on Norvasc but Mr. [name] states that was d/c and he was placed back on Verapamil. REFILLS TO GO TO A NEW PHARMACY. THEY ARE REQUESTING 90 DAY SUPPLY TO [facility]. He states that meds are much less expensive to [place] for 90 days. Pt can be reached at [phone].

## 2017-05-09 ENCOUNTER — Other Ambulatory Visit: Payer: Self-pay | Admitting: Medical

## 2017-05-09 MED ORDER — VERAPAMIL HCL ER 180 MG PO TBCR: 180.0000 mg | EXTENDED_RELEASE_TABLET | Freq: Every day | ORAL | 0 refills | Status: AC

## 2017-05-09 MED ORDER — LISINOPRIL-HYDROCHLOROTHIAZIDE 20-12.5 MG PO TABS: 1.0000 | ORAL_TABLET | Freq: Every day | ORAL | 0 refills | Status: DC

## 2017-05-09 MED ORDER — CARBAMAZEPINE 200 MG PO TABS: ORAL_TABLET | ORAL | 0 refills | Status: DC

## 2017-05-09 MED ORDER — TRAZODONE HCL 100 MG PO TABS: ORAL_TABLET | ORAL | 0 refills | Status: DC

## 2017-05-09 MED ORDER — METFORMIN HCL 500 MG PO TABS: 500.0000 mg | ORAL_TABLET | Freq: Two times a day (BID) | ORAL | 0 refills | Status: DC

## 2017-05-09 MED ORDER — BUPROPION HCL 100 MG PO TABS: 100.0000 mg | ORAL_TABLET | Freq: Two times a day (BID) | ORAL | 0 refills | Status: DC

## 2017-05-09 MED ORDER — TEMAZEPAM 15 MG PO CAPS: 15.0000 mg | ORAL_CAPSULE | Freq: Every evening | ORAL | 0 refills | Status: DC | PRN

## 2017-05-09 MED ORDER — CITALOPRAM HYDROBROMIDE 40 MG PO TABS: 40.0000 mg | ORAL_TABLET | Freq: Every day | ORAL | 0 refills | Status: DC

## 2017-05-09 NOTE — Telephone Encounter (Signed)
Done

## 2017-05-09 NOTE — Telephone Encounter (Signed)
Call out Temazepam, but get in for med check.  He was also advised last visit to establish with psychiatry such as [facility].   What is the status of that?  All other meds [facility]

## 2017-05-10 ENCOUNTER — Other Ambulatory Visit: Payer: Self-pay | Admitting: Medical

## 2017-05-22 ENCOUNTER — Ambulatory Visit (INDEPENDENT_AMBULATORY_CARE_PROVIDER_SITE_OTHER): Payer: Self-pay | Admitting: Medical

## 2017-05-22 ENCOUNTER — Encounter: Payer: Self-pay | Admitting: Medical

## 2017-05-22 VITALS — BP 122/80 | HR 64 | Wt 155.0 lb

## 2017-05-22 DIAGNOSIS — E782 Mixed hyperlipidemia: Secondary | ICD-10-CM

## 2017-05-22 DIAGNOSIS — E118 Type 2 diabetes mellitus with unspecified complications: Secondary | ICD-10-CM

## 2017-05-22 DIAGNOSIS — G4700 Insomnia, unspecified: Secondary | ICD-10-CM

## 2017-05-22 DIAGNOSIS — F17200 Nicotine dependence, unspecified, uncomplicated: Secondary | ICD-10-CM

## 2017-05-22 DIAGNOSIS — I10 Essential (primary) hypertension: Secondary | ICD-10-CM

## 2017-05-22 DIAGNOSIS — F3341 Major depressive disorder, recurrent, in partial remission: Secondary | ICD-10-CM

## 2017-05-22 DIAGNOSIS — K739 Chronic hepatitis, unspecified: Secondary | ICD-10-CM

## 2017-05-22 DIAGNOSIS — F1911 Other psychoactive substance abuse, in remission: Secondary | ICD-10-CM | POA: Insufficient documentation

## 2017-05-22 DIAGNOSIS — T148XXA Other injury of unspecified body region, initial encounter: Secondary | ICD-10-CM | POA: Insufficient documentation

## 2017-05-22 LAB — POCT GLYCOSYLATED HEMOGLOBIN (HGB A1C)

## 2017-05-22 MED ORDER — TEMAZEPAM 15 MG PO CAPS: 15.0000 mg | ORAL_CAPSULE | Freq: Every evening | ORAL | 0 refills | Status: DC | PRN

## 2017-05-22 NOTE — Progress Notes (Signed)
Subjective: Chief Complaint  Patient presents with  . med check    med check    Here for med check.  Here with [name] his caregiver.  Insomnia - taking Trazodone 100mg , 1 tablet daily, Temazepam 15mg  QHS.  Sleep is doing ok on this regimen.  Mood - taking Celexa 40mg  daily, Tegretol 200mg  TID, Wellbutrin 100mg  BID.   Mood has been fine, no c/o.   HTN - taking Lisinopril HCT 20/12.5mg  daily, Verapamil 180mg  daily.   Not checking BPs  Diabetes - taking Metformin 500mg  BID.   Not checking sugars.     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  Has eyelid droop since then.     He fell last night hitting right lateral face on floor. Dog was running around and he got tangled up in the bed sheet and fell.   No LOC, no other concern.  Feels fine other than the abrasion.     No current alcohol, declines drug use although was cocaine + earlier in the year.    Still sees hepatitis clinic regularly.   Goes back again in 08/2017, will be having updated imaging then.  No prior colonoscopy, declines having them.  He is trying to eat like a normal person. [name] says he is not binging of late.   Eating on average twice daily.     Past Medical History:  Diagnosis Date  . Arthritis   . COPD (chronic obstructive pulmonary disease) ([place])   . Depression   . Diabetes mellitus without complication ([place]) 24/40  . Hepatitis C   . Hypertension   . Mood disorder ([place])   . Obesity   . Obsessive compulsive disorder   . Tobacco use disorder    Current Outpatient Prescriptions on File Prior to Visit  Medication Sig Dispense Refill  . buPROPion (WELLBUTRIN) 100 MG tablet Take 1 tablet (100 mg total) by mouth 2 (two) times daily. 180 tablet 0  . carbamazepine (TEGRETOL) 200 MG tablet TAKE 1 TABLET (200 MG TOTAL) BY MOUTH 3 (THREE) TIMES DAILY. 270 tablet 0  . citalopram (CELEXA) 40 MG tablet Take 1 tablet (40 mg total) by mouth daily. 90 tablet 0  . lisinopril-hydrochlorothiazide  (PRINZIDE,ZESTORETIC) 20-12.5 MG tablet Take 1 tablet by mouth daily. 90 tablet 0  . metFORMIN (GLUCOPHAGE) 500 MG tablet Take 1 tablet (500 mg total) by mouth 2 (two) times daily with a meal. 180 tablet 0  . traZODone (DESYREL) 100 MG tablet TAKE 1/2 TO 1 TABLET BY MOUTH DAILY AT BEDTIME 90 tablet 0  . verapamil (CALAN-SR) 180 MG CR tablet Take 1 tablet (180 mg total) by mouth daily. 90 tablet 0   No current facility-administered medications on file prior to visit.     Past Surgical History:  Procedure Laterality Date  . COLONOSCOPY     never    ROS as in subjective     Objective: BP 122/80   Pulse 64   Wt 155 lb (70.3 kg)   SpO2 98%   BMI 25.02 kg/m   Wt Readings from Last 3 Encounters:  05/22/17 155 lb (70.3 kg)  12/20/16 157 lb 9.6 oz (71.5 kg)  09/27/16 163 lb 9.6 oz (74.2 kg)   General appearance: alert, no distress, WD/WN, body odor+, but no worse appearance than normal for him.   HEENT: normocephalic, sclerae anicteric, right medial upper eyelid with ptosis deformity from where he has biopsy in corner of right eye, medial canthus region.  EOMi, PERRLA, TMs pearly, nares patent, no discharge or erythema, pharynx normal Oral cavity: MMM, no lesions Neck: supple, no lymphadenopathy, no thyromegaly, no masses, no bruits Heart: RRR, normal S1, S2, no murmurs Lungs: CTA bilaterally, no wheezes, rhonchi, or rales Pulses: 1+ symmetric, upper extremities, 1 + lower extremities, normal cap refill Psychiatry - pleasant, good eye contact, answers questions appropriately Right lateral orbit with approx 1.5cm x 3mm abrasion, and few linear 3-73mm x 63mm oval abrasions left forearm Tender in areas of the abrasions, but otherwise no deformity normal left wrist and arm movement Mild action tremor unchanged from prior, otherwise Arms and legs neurovascularly intact  Reviewed labs from 04/12/17 CBC WNL CMET WNL Hep C -     Assessment: Encounter Diagnoses  Name Primary?  .  Diabetes mellitus with complication ([place]) Yes  . Essential hypertension   . Abrasion   . Chronic hepatitis ([place])   . Mixed hyperlipidemia   . Recurrent major depressive disorder, in partial remission ([place])   . Tobacco use disorder   . Insomnia, unspecified type   . History of substance abuse     Plan: Overall doing fine except fall and abrasion from last night.   Reviewed 04/2017 labs from [facility].   Reviewed hepatiis clinic notes from [facility] 04/12/17.  Hep A/B vaccine updated 04/2017 at hepatitis clinic  He will inquire about Td and pneumococcal 23 vaccine with hepatitis clinic or can check local pharmacy costs as it would be cost prohibitive here.    Insomnia - doing fine on current regimen  Diabetes - HgbA1C 5.4%.  We will stop metformin for now given his diet and weight loss changes, mainly given his reportedly abstinence of alcohol  HTN - c/t same medications for now but monitor BPs.  Discussed goals.  If running hypotensive in the coming weeks, we would need to cut back on medications for BP.    Advised he not smoke at all.   C/t rest of medications as usual.  Next visit plan fasting lipid panel  Of note, cocaine + on drug screen 08/2016.  He denies current drug use, denies recent alcohol use.   He still smokes some.  Abrasion - cleaned right orbit wound, applied bacitracin ointment.   discussed wound care for right orbit and left posterior forearm abrasions.   [name] was seen today for med check.  Diagnoses and all orders for this visit:  Diabetes mellitus with complication ([place]) -     HgB A1c  Essential hypertension  Abrasion  Chronic hepatitis ([facility])  Mixed hyperlipidemia  Recurrent major depressive disorder, in partial remission ([place])  Tobacco use disorder  Insomnia, unspecified type  History of substance abuse  Other orders -     temazepam (RESTORIL) 15 MG capsule; Take 1 capsule (15 mg total) by mouth at bedtime as needed for sleep.

## 2017-05-22 NOTE — Patient Instructions (Addendum)
Recommendations Diabetes;  Stop Metformin for now  Check sugars a few mornings per week fasting before breakfast  If glucose readings are staying above 130, then we will need to restart Metformin  Keep me informed  Blood pressure  Check blood pressure such as home monitor or at [facility] periodically  Goal is around 120/70  If your reading are consistently lower such as 100/60 or routinely less than 116/70, then let me know and I'll modify the blood pressure medication  If not, continue the current Lisinopril HCT and Verapamil blood pressure medications  Follow up as planned with [facility] liver clinic  Continue rest of medication as usual   Ask [facility] about pneumonia vaccines.  You may be able to get them updated there.

## 2017-07-17 ENCOUNTER — Other Ambulatory Visit: Payer: Self-pay | Admitting: Medical

## 2017-07-17 NOTE — Telephone Encounter (Signed)
Can pt have a refill on this 

## 2017-07-22 ENCOUNTER — Other Ambulatory Visit: Payer: Self-pay | Admitting: Medical

## 2017-07-26 ENCOUNTER — Telehealth: Payer: Self-pay | Admitting: Medical

## 2017-07-26 NOTE — Telephone Encounter (Signed)
Called in temazepam rx to [facility]

## 2017-09-04 ENCOUNTER — Other Ambulatory Visit: Payer: Self-pay | Admitting: Medical

## 2017-09-04 NOTE — Telephone Encounter (Signed)
Call out

## 2017-09-04 NOTE — Telephone Encounter (Signed)
Can pt have a refill on meds  

## 2017-09-06 ENCOUNTER — Telehealth: Payer: Self-pay

## 2017-09-06 NOTE — Telephone Encounter (Signed)
Called in temazepam for pt to [facility]. Pt  Family member says he is almost out and pharmacy says that it was picked up and this script will be kept on file. [place]

## 2017-09-11 ENCOUNTER — Institutional Professional Consult (permissible substitution): Payer: Self-pay | Admitting: Medical

## 2017-09-15 ENCOUNTER — Institutional Professional Consult (permissible substitution): Payer: Self-pay | Admitting: Medical

## 2017-09-16 ENCOUNTER — Encounter: Payer: Self-pay | Admitting: Medical

## 2017-09-18 ENCOUNTER — Encounter: Payer: Self-pay | Admitting: Medical

## 2017-09-18 ENCOUNTER — Ambulatory Visit (INDEPENDENT_AMBULATORY_CARE_PROVIDER_SITE_OTHER): Payer: Self-pay | Admitting: Medical

## 2017-09-18 VITALS — BP 120/68 | HR 87 | Wt 149.2 lb

## 2017-09-18 DIAGNOSIS — F0630 Mood disorder due to known physiological condition, unspecified: Secondary | ICD-10-CM

## 2017-09-18 DIAGNOSIS — Z87898 Personal history of other specified conditions: Secondary | ICD-10-CM

## 2017-09-18 DIAGNOSIS — F339 Major depressive disorder, recurrent, unspecified: Secondary | ICD-10-CM

## 2017-09-18 DIAGNOSIS — G4700 Insomnia, unspecified: Secondary | ICD-10-CM

## 2017-09-18 DIAGNOSIS — C220 Liver cell carcinoma: Secondary | ICD-10-CM

## 2017-09-18 DIAGNOSIS — E118 Type 2 diabetes mellitus with unspecified complications: Secondary | ICD-10-CM

## 2017-09-18 DIAGNOSIS — E782 Mixed hyperlipidemia: Secondary | ICD-10-CM

## 2017-09-18 DIAGNOSIS — Z8619 Personal history of other infectious and parasitic diseases: Secondary | ICD-10-CM

## 2017-09-18 DIAGNOSIS — F1911 Other psychoactive substance abuse, in remission: Secondary | ICD-10-CM

## 2017-09-18 DIAGNOSIS — J449 Chronic obstructive pulmonary disease, unspecified: Secondary | ICD-10-CM

## 2017-09-18 DIAGNOSIS — R52 Pain, unspecified: Secondary | ICD-10-CM

## 2017-09-18 DIAGNOSIS — F17200 Nicotine dependence, unspecified, uncomplicated: Secondary | ICD-10-CM

## 2017-09-18 DIAGNOSIS — I10 Essential (primary) hypertension: Secondary | ICD-10-CM

## 2017-09-18 DIAGNOSIS — F419 Anxiety disorder, unspecified: Secondary | ICD-10-CM

## 2017-09-18 NOTE — Progress Notes (Addendum)
Subjective: Chief Complaint  Patient presents with  . discusshealth  changes   "[name]" [place]) is here with his caregiver Duane Boston that normally brings him in.   He has hx/o hepatitis C, was treated for this in the past year, but in January after losing weight unexpectedly was found to have liver cancer.   After several discussions with his team at [facility] it is believed that his cancer is aggressive and likely unresponsive to therapies available.  He notes he was told to return here to discussed palliative care and consider resources available.    He is staying in quite a bit of pain lately.    [name] is worried that there will come a time in which he is unable to care for himself and [name] will need outside help.  [name] is ambulatory, doesn't bath daily but does take care of himself. [name] looks after him and provides his housing and safe place.   [name] and [name] have questions about local resources, hospice, and others.  Past Medical History:  Diagnosis Date  . Arthritis   . COPD (chronic obstructive pulmonary disease) ([name])   . Depression   . Diabetes mellitus without complication ([name]) 54/00  . Hepatitis C   . Hypertension   . Mood disorder ([place])   . Obesity   . Obsessive compulsive disorder   . Tobacco use disorder    Current Outpatient Medications on File Prior to Visit  Medication Sig Dispense Refill  . buPROPion (WELLBUTRIN) 100 MG tablet TAKE ONE TABLET BY MOUTH TWICE DAILY  180 tablet 0  . carbamazepine (TEGRETOL) 200 MG tablet TAKE 1 TABLET (200 MG TOTAL) BY MOUTH 3 (THREE) TIMES DAILY. 270 tablet 1  . citalopram (CELEXA) 40 MG tablet TAKE ONE TABLET BY MOUTH ONE TIME DAILY  90 tablet 1  . lisinopril-hydrochlorothiazide (PRINZIDE,ZESTORETIC) 20-12.5 MG tablet TAKE ONE TABLET BY MOUTH ONE TIME DAILY  90 tablet 0  . temazepam (RESTORIL) 15 MG capsule TAKE 1 CAPSULE BY MOUTH AT BEDTIME AS NEEDED FOR SLEEP 90 capsule 0  . traZODone (DESYREL) 100 MG tablet TAKE 1/2  TO 1 TABLET BY MOUTH DAILY AT BEDTIME 90 tablet 0  . verapamil (CALAN-SR) 180 MG CR tablet Take 1 tablet (180 mg total) by mouth daily. 90 tablet 0   No current facility-administered medications on file prior to visit.    ROS as in subjective   Objective: BP 120/68   Pulse 87   Wt 149 lb 3.2 oz (67.7 kg)   SpO2 96%   BMI 24.08 kg/m   Gen: he has obviously lost weight recently He seems a little distant at times in the conversation, at other times seems to be holding back some tears.   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  See other imagine in Care Everywhere under [facility]   Assessment: Encounter Diagnoses  Name Primary?  . Hepatocellular carcinoma ([place]) Yes  . History of hepatitis C   . Anxiety   . History of substance abuse   . Diabetes mellitus with complication ([name])   . Mixed hyperlipidemia   . Mood disorder in conditions classified elsewhere   . Tobacco use disorder   . Insomnia, unspecified type   . Essential hypertension   . Depression, recurrent ([name])   . Chronic obstructive pulmonary disease,  unspecified COPD type ([name])   . Pain     Plan: I reviewed his hematology oncology notes from [name] at [facility].  He has a recent diagnosis of significant disease progression of hepatocellular carcinoma.  She did not feel he was safe to proceed with TARE therapy.  Since he lives in [place], it was recommended that he try to localize care to [facility].  She did mention that if he pursues treatment with Lenvatinib, he would need to switch off Celexa to Zoloft due to drug interaction of QT prolongation.  See her office note from 09/15/17 more details.    He is status post Harvoni treatment completed in April 2018 for hepatitis C   We  spent the bulk of the time today discussing his emotional state, his caregiver concerns, and the variety of choices as we go forward over the next few weeks they.  I expressed my sympathy for his situation and the recent unfortunate news.  They are leaning more towards the idea of palliative care, but still want to consider treatment for the cancer.  We discussed the role that hospice can play in the therapies they can provide either in the home or in a hospice center  His caregiver [name] seems to have concerns about as he progressively gets worse, [name] can't physically meet all of his needs.   Of note, [name]'s wife passed away unexpectedly last year, so [name] is particularly down about having his friend [name] face this diagnosis that seems to be rapidly worsening.   I do not know the full details of his home situation, but it has always been my understanding that his caregiver [name] is both his friend and looks after his affairs, often pays for his visits here, but there is also another gentleman that lives at the home.  So it somewhat of a group home situation.  However [name] up until now has been the one mainly administering his own medications, he takes care of his hygiene efforts although we do not believe this is daily.  But as his health declines [name] does not think he would be able to do all that he needs done for his care, thus wanted to discuss getting a caseworker involved, possibly hospice.  [name] plans to have his pastor [name], [name] come out and talk to [name].     Addendum After speaking with my office staff and after many phone calls from our front office person [name], we have given [name] and [place] some information to make some decisions.  At this point we will refer to oncology here with [place], [name] will be speaking to the case manager at the cancer center, and they will be able to give him additional information and other  resources.  [name] and [place] will also be considering hospice.  If they decide that this would be the better route that we can help with that referral.  Diabetes - HgbA1C 5.4% in October 2018.  Currently not on medication.    HTN - for now continue same medication  Depression, anxiety, mood disorder -I have been seeing [name] for several years now.  We have urged him numerous times in the past several years to get insurance through Medicare or Medicaid if possible.  For whatever reason they have not made this happen.  He has also been reluctant to go see a therapist or psychiatrist in recent years here in [place].  He may have seen the mental health  professional at [facility] but I am not sure.  Dr. Redmond School my supervising physician and myself have tried to work with clear for over the years to treat his depression and mood issues.  We have several years ago he has come a long [place].  Several years ago he was drinking a lot of alcohol and his depression was a lot worse.   however since his diagnosis of diabetes he really seemed to take things more seriously, has done a lot better in diet, less alcohol.  Really had cleaned things up.    However, when he had to come off the carbamazepine during the [facility] treatment, he has not really done well since then.  Ideally a psychiatrist and therapist would be better to manage his mental health issues going forward, but we have basically been treating his mental health issues as best we can here.     Insomnia - c/t same medication  He continues to smoke  He has a hx/o alcohol abuse, remote hx/o IV drug use.    I do not see any recent lab work in the chart, and since he is self-pay, he has been getting a lot of his labs and treatment at [facility] in the last year.  At this point he will need some updated labs in the near future.    Next steps Referral to [facility] oncology ASAP here locally to help ease some of the travel burden to [facility] Hopefully he will get a  caseworker involved in his care with this referral I recommend Counseling and psychiatry consult with a mental health professional or through hospice He will need pain control We need some updated labs Consider hospice referral in the near future

## 2017-09-20 ENCOUNTER — Telehealth: Payer: Self-pay | Admitting: Medical

## 2017-09-20 ENCOUNTER — Encounter: Payer: Self-pay | Admitting: General Practice

## 2017-09-20 DIAGNOSIS — Z8619 Personal history of other infectious and parasitic diseases: Secondary | ICD-10-CM | POA: Insufficient documentation

## 2017-09-20 DIAGNOSIS — C220 Liver cell carcinoma: Secondary | ICD-10-CM | POA: Insufficient documentation

## 2017-09-20 DIAGNOSIS — J449 Chronic obstructive pulmonary disease, unspecified: Secondary | ICD-10-CM | POA: Insufficient documentation

## 2017-09-20 DIAGNOSIS — R52 Pain, unspecified: Secondary | ICD-10-CM | POA: Insufficient documentation

## 2017-09-20 NOTE — Telephone Encounter (Signed)
Called and spoke to [place] concerning pt's care. Explained the information I collected for pt yesterday. What his options were and what we would do to move forward. He stated that pt would like to move forward with seeking care a [facility]. Spoke to [place] this am and requested that she put referral in as soon as she can. Once that referral is in she will advise me and I will call the Cancer Center's case worker.

## 2017-09-20 NOTE — Telephone Encounter (Signed)
Called and left a message for [name]. Informed of message from Audelia Acton and giving him a status update on referral, Hospital doctor and case worker thru [facility] cancer center.

## 2017-09-20 NOTE — Progress Notes (Signed)
[place] CSW Progress Note  Call from Cammy Brochure, Engineer, building services for [facility].  Patient lives w  caregiver, [name]. [name] has also providing all transportation for patient appointments in the past.   Practice wants to help patient w application for insurance and disability.  CSW referred patient to [place] for Medicaid.  Per Rowland Lathe, Financial Advocate Lenise will assist w financial resources/referrals.  CSW will refer to [facility] for help w disability application if patient consents.    Edwyna Shell, LCSW Clinical Social Worker Phone:  [phone]

## 2017-09-20 NOTE — Telephone Encounter (Signed)
[name], I have completed my office note from 2 days ago.  Go ahead and call the cancer center to get caseworker involved.  We are working on the referral to oncology.  The electronic referral is in the system already.  Please let Jeffrey and [name] know that I have reviewed his chart, and I agree with his oncologist at [facility] and what you found out yesterday, about getting him hooked in with local oncology here at home to minimize transportation issues.  I appreciate the work you are doing [name] to get the caseworker involved in the get him insurance and resources  Please let [name] know that once he sees oncology soon, oncology or our office can refer to Hospice if that is the decision they want to make  As you also mentioned yesterday the caseworker will be able to help in a big [place] here.  Regarding his other health issues such as diabetes, high blood pressure, he can continue the same medicines for right now.  However if he continues to lose weight we may need to adjust certain things like his blood pressure medication.  I also recommend him getting established with mental health care.  Some of this can be directed through oncology or hospice.  I want them to keep this in mind as they speak with the caseworker and oncology office  Please see if [name] or [name] has any other questions for me at the moment

## 2017-09-27 ENCOUNTER — Ambulatory Visit: Payer: Self-pay | Admitting: Hematology & Oncology

## 2017-09-27 ENCOUNTER — Other Ambulatory Visit: Payer: Self-pay

## 2017-09-29 ENCOUNTER — Inpatient Hospital Stay: Payer: Medicaid Other | Attending: Hematology & Oncology | Admitting: Hematology & Oncology

## 2017-09-29 ENCOUNTER — Inpatient Hospital Stay: Payer: Medicaid Other

## 2017-09-29 VITALS — BP 146/73 | HR 91 | Temp 98.2°F | Resp 19 | Wt 146.5 lb

## 2017-09-29 DIAGNOSIS — F17210 Nicotine dependence, cigarettes, uncomplicated: Secondary | ICD-10-CM | POA: Diagnosis not present

## 2017-09-29 DIAGNOSIS — R634 Abnormal weight loss: Secondary | ICD-10-CM | POA: Diagnosis not present

## 2017-09-29 DIAGNOSIS — I81 Portal vein thrombosis: Secondary | ICD-10-CM | POA: Diagnosis not present

## 2017-09-29 DIAGNOSIS — F329 Major depressive disorder, single episode, unspecified: Secondary | ICD-10-CM | POA: Diagnosis not present

## 2017-09-29 DIAGNOSIS — C220 Liver cell carcinoma: Secondary | ICD-10-CM | POA: Diagnosis not present

## 2017-09-29 DIAGNOSIS — B1920 Unspecified viral hepatitis C without hepatic coma: Secondary | ICD-10-CM

## 2017-09-29 DIAGNOSIS — R530 Neoplastic (malignant) related fatigue: Secondary | ICD-10-CM | POA: Insufficient documentation

## 2017-09-29 DIAGNOSIS — G893 Neoplasm related pain (acute) (chronic): Secondary | ICD-10-CM | POA: Insufficient documentation

## 2017-09-29 DIAGNOSIS — Z66 Do not resuscitate: Secondary | ICD-10-CM | POA: Diagnosis not present

## 2017-09-29 LAB — CMP (CANCER CENTER ONLY)
ALBUMIN: 2.9 g/dL — AB (ref 3.5–5.0)
ALT: 35 U/L (ref 10–47)
AST: 74 U/L — ABNORMAL HIGH (ref 11–38)
Alkaline Phosphatase: 202 U/L — ABNORMAL HIGH (ref 26–84)
Anion gap: 8 (ref 5–15)
BILIRUBIN TOTAL: 0.7 mg/dL (ref 0.2–1.6)
BUN: 16 mg/dL (ref 7–22)
CHLORIDE: 103 mmol/L (ref 98–108)
CO2: 30 mmol/L (ref 18–33)
CREATININE: 0.8 mg/dL (ref 0.60–1.20)
Calcium: 9.9 mg/dL (ref 8.0–10.3)
Glucose, Bld: 117 mg/dL (ref 73–118)
POTASSIUM: 4.1 mmol/L (ref 3.3–4.7)
SODIUM: 141 mmol/L (ref 128–145)
Total Protein: 7.5 g/dL (ref 6.4–8.1)

## 2017-09-29 LAB — CBC WITH DIFFERENTIAL (CANCER CENTER ONLY)
Basophils Absolute: 0 10*3/uL (ref 0.0–0.1)
Basophils Relative: 0 %
EOS ABS: 0 10*3/uL (ref 0.0–0.5)
Eosinophils Relative: 0 %
HEMATOCRIT: 36.9 % — AB (ref 38.7–49.9)
Hemoglobin: 11.8 g/dL — ABNORMAL LOW (ref 13.0–17.1)
LYMPHS ABS: 1 10*3/uL (ref 0.9–3.3)
LYMPHS PCT: 7 %
MCH: 29.5 pg (ref 28.0–33.4)
MCHC: 32 g/dL (ref 32.0–35.9)
MCV: 92.3 fL (ref 82.0–98.0)
MONOS PCT: 9 %
Monocytes Absolute: 1.3 10*3/uL — ABNORMAL HIGH (ref 0.1–0.9)
Neutro Abs: 11.6 10*3/uL — ABNORMAL HIGH (ref 1.5–6.5)
Neutrophils Relative %: 84 %
Platelet Count: 417 10*3/uL — ABNORMAL HIGH (ref 145–400)
RBC: 4 MIL/uL — AB (ref 4.20–5.70)
RDW: 15.8 % — ABNORMAL HIGH (ref 11.1–15.7)
WBC Count: 13.9 10*3/uL — ABNORMAL HIGH (ref 4.0–10.0)

## 2017-09-29 MED ORDER — OXYCODONE HCL 5 MG PO TABS: 5.0000 mg | ORAL_TABLET | Freq: Four times a day (QID) | ORAL | 0 refills | Status: DC | PRN

## 2017-09-29 MED ORDER — FENTANYL 25 MCG/HR TD PT72: 25.0000 ug | MEDICATED_PATCH | TRANSDERMAL | 0 refills | Status: AC

## 2017-09-29 NOTE — Telephone Encounter (Signed)
Pt is set up for appt for march the 1st

## 2017-09-29 NOTE — Progress Notes (Signed)
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 : I have liver cancer.  HPI: [name] is a very nice 57 year old white male.  Shockingly enough, he went to the same high school as my wife and graduated with her.  He used to be in the business of tree service.  He has 5 children by 4 different women.  He was doing heroin.  He unfortunately contracted hepatitis C.  He was treated for this at [facility].  He received Harvoni.  He completed Harvoni in April 2018.  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  He subsequently underwent excision for this.  This was at [facility].  He had been followed up by gastroenterology at [facility].  He was then found to have a large mass in the liver.  This measured 15 x 10 x 13.2 cm.  This was with an MRI done on 08/15/2017.  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  He had lost quite a bit of weight.  He was to be placed onto a clinical trial.  He had a liver biopsy done.  I have to believe this was hepatocellular carcinoma.  He was supposed to be on a trial with TARE/pembrolizumab.  However, his disease was progressing quickly.  His performance status was decreasing.  A follow-up MRI showed increase in size of the hepatocellular mass in the left lobe.  There was no tumor throughout the right lobe of the liver.  Because he lives in [place], it was felt that his end-of-life care should be managed by [place] locally.  As such, he was kindly referred to the [facility] for an evaluation.  He is having quite a bit of pain.  It is no surprise that his family doctor will not give him any pain medication.  In this area of opioid prescribing, family doctor is just will not prescribe pain medication.  He has been taking a lot of Advil.  He says he takes 16 Advil a day.  He has lost quite a bit of  weight.  His weight now is 146 pounds.    He comes in with his roommate.  It is amazing that his roommate is helping take care of him.  I think it is incredibly inspiring that his roommate has taken upon himself to try to help [name].  He is not eating much.  He has had no fever.  He has had no cough.  He still smoking.  He smoked about half a pack per day.  He used to drink beer.  He has had no bleeding.  There is been no leg swelling.  Overall, I would say that his performance status is ECOG 3.    Past Medical History:  Diagnosis Date  . Arthritis   . COPD (chronic obstructive pulmonary disease) ([place])   . Depression   . Diabetes mellitus without complication ([place]) 74/25  . Hepatitis C   . Hypertension   . Mood disorder ([place])   . Obesity   . Obsessive compulsive disorder   . Tobacco use disorder   :  Past Surgical History:  Procedure Laterality Date  . COLONOSCOPY     never  :   Current Outpatient Medications:  .  amLODipine (NORVASC) 10 MG tablet, Take 10 mg by mouth daily., Disp: , Rfl:  .  buPROPion (WELLBUTRIN) 100 MG tablet, TAKE ONE TABLET BY MOUTH TWICE DAILY , Disp: 180 tablet, Rfl: 0 .  carbamazepine (TEGRETOL) 200 MG tablet, TAKE 1 TABLET (200 MG TOTAL) BY MOUTH 3 (THREE) TIMES DAILY., Disp: 270 tablet, Rfl: 1 .  citalopram (CELEXA) 40 MG tablet, TAKE ONE TABLET BY MOUTH ONE TIME DAILY , Disp: 90 tablet, Rfl: 1 .  fentaNYL (DURAGESIC - DOSED MCG/HR) 25 MCG/HR patch, Place 1 patch (25 mcg total) onto the skin every 3 (three) days., Disp: 10 patch, Rfl: 0 .  lisinopril-hydrochlorothiazide (PRINZIDE,ZESTORETIC) 20-12.5 MG tablet, TAKE ONE TABLET BY MOUTH ONE TIME DAILY , Disp: 90 tablet, Rfl: 0 .  oxyCODONE (OXY IR/ROXICODONE) 5 MG immediate release tablet, Take 1 tablet (5 mg total) by mouth every 6 (six) hours as needed for severe pain., Disp: 90 tablet, Rfl: 0 .  temazepam (RESTORIL) 15 MG capsule, TAKE 1 CAPSULE BY MOUTH AT BEDTIME AS NEEDED FOR SLEEP, Disp: 90  capsule, Rfl: 0 .  traZODone (DESYREL) 100 MG tablet, TAKE 1/2 TO 1 TABLET BY MOUTH DAILY AT BEDTIME, Disp: 90 tablet, Rfl: 0 .  verapamil (CALAN-SR) 180 MG CR tablet, Take 1 tablet (180 mg total) by mouth daily., Disp: 90 tablet, Rfl: 0:  :  No Known Allergies:  No family history on file.:  Social History   Socioeconomic History  . Marital status: Single    Spouse name: Not on file  . Number of children: Not on file  . Years of education: Not on file  . Highest education level: Not on file  Social Needs  . Financial resource strain: Not on file  . Food insecurity - worry: Not on file  . Food insecurity - inability: Not on file  . Transportation needs - medical: Not on file  . Transportation needs - non-medical: Not on file  Occupational History  . Not on file  Tobacco Use  . Smoking status: Current Every Day Smoker    Packs/day: 3.00    Types: Cigarettes  . Smokeless tobacco: Never Used  Substance and Sexual Activity  . Alcohol use: No  . Drug use: Yes  . Sexual activity: Yes    Partners: Female  Other Topics Concern  . Not on file  Social History Narrative  . Not on file  :  Review of Systems  Constitutional: Positive for malaise/fatigue and weight loss.  Eyes: Positive for pain and discharge.  Respiratory: Positive for shortness of breath.   Cardiovascular: Positive for palpitations.  Gastrointestinal: Positive for abdominal pain and nausea.  Genitourinary: Positive for urgency.  Musculoskeletal: Positive for back pain and myalgias.  Neurological: Positive for dizziness, tingling and weakness.  Psychiatric/Behavioral: Positive for depression.     Exam: Chronically ill, elderly appearing white male who looks a lot older than his stated age.  His vital signs show a temperature of 98.2.  Pulse 91.  Blood pressure 146/73.  Weight is 146 pounds.  Head neck exam shows no ocular or oral lesions.  He does have some slight proptosis of the right eye secondary to his  surgery for the squamous cell cancer.  He has no scleral icterus.  He has no adenopathy in the neck.  He has some temporal muscle wasting.  Lungs are clear bilaterally.  Cardiac exam regular rate and rhythm with no murmurs, rubs or bruits.  Abdomen is soft.  His liver edge extends about 8 cm below the right costal margin.  His liver extends about 6 cm across the midline.  He has no fluid wave.  Bowel sounds are present.  Back exam shows no tenderness over the spine, ribs or hips.  Extremities shows muscle atrophy in upper and lower extremities.  Skin exam shows no jaundice.  Neurological exam shows no obvious neurological deficits. @IPVITALS @   Recent Labs    09/29/17 1507  WBC 13.9*  HCT 36.9*  PLT 417*   Recent Labs    09/29/17 1507  NA 141  K 4.1  CL 103  CO2 30  GLUCOSE 117  BUN 16  CREATININE 0.80  CALCIUM 9.9    Blood smear review: None  Pathology: As above    Assessment and Plan: Mr. Ransom is a 57 year old white male.  He has rapidly progressive hepatocellular carcinoma.  He has not been able to be treated because of disease progression and a decline in his performance status.  I would have to think that his prognosis is no more than 2 months.  He has lost quite a bit of weight.  He is having quite a bit of pain.  We really need to focus on his quality of life.  I went ahead and gave him a prescription for oxycodone (5 mg p.o. every 4 hours as needed) and Duragesic patch (25 mcg every 3 days to the skin).  His roommate will make sure that he is one who will be in charge of giving [name] his medications.  Hospice clearly is necessary.  I will get hospice involved.  I think they will be able to help out tremendously.  If [name] cannot be at home, then he can go to the hospice home.  I just feel bad that we cannot do more for [name].  However, he understands that he is terminal.  He does not want to be kept alive on machines.  He does not want to be artificially kept  alive.  He does not want to be resuscitated.  I agree with this.  As such, he is a DO NOT RESUSCITATE.  I spent about an hour with he and his roommate.  Over 50% of the time was spent face-to-face going over the record from [facility], and discussing how we can help his quality of life so he will have respect and dignity.  I really do not think we have to get him back to the office.  I think that hospice can really do a great job and try to keep him at home.

## 2017-09-29 DEATH — deceased

## 2017-09-30 LAB — AFP TUMOR MARKER: AFP, Serum, Tumor Marker: 726.3 ng/mL — ABNORMAL HIGH (ref 0.0–8.3)

## 2017-10-02 ENCOUNTER — Telehealth: Payer: Self-pay | Admitting: *Deleted

## 2017-10-02 LAB — LACTATE DEHYDROGENASE: LDH: 557 U/L — AB (ref 125–245)

## 2017-10-02 NOTE — Telephone Encounter (Signed)
Per Dr Marin Olp,  Spoke to [employer] at [place] for new patient referral. They will reach out to patient to schedule visit.

## 2017-10-04 LAB — HEMOCHROMATOSIS DNA-PCR(C282Y,H63D)

## 2017-10-14 ENCOUNTER — Other Ambulatory Visit: Payer: Self-pay | Admitting: Hematology and Oncology

## 2017-10-14 DIAGNOSIS — G893 Neoplasm related pain (acute) (chronic): Secondary | ICD-10-CM

## 2017-10-14 MED ORDER — OXYCODONE HCL 5 MG PO TABS: 5.0000 mg | ORAL_TABLET | ORAL | 0 refills | Status: AC | PRN

## 2017-11-08 ENCOUNTER — Other Ambulatory Visit: Payer: Self-pay | Admitting: Medical

## 2017-11-09 NOTE — Telephone Encounter (Signed)
Okay to refill? 09/2017 last appointment

## 2017-11-09 NOTE — Telephone Encounter (Signed)
See below

## 2017-11-20 ENCOUNTER — Telehealth: Payer: Self-pay | Admitting: *Deleted

## 2017-11-20 NOTE — Telephone Encounter (Signed)
Received word from [facility] that patient expired on April 20th at 9:40am.  Dr. Marin Olp notified.

## 2017-12-18 ENCOUNTER — Telehealth: Payer: Self-pay | Admitting: Family Medicine

## 2017-12-18 NOTE — Telephone Encounter (Signed)
Sympathy card sent
